# Patient Record
Sex: Female | Born: 1935 | Race: Black or African American | Hispanic: No | Smoking: Never smoker
Health system: Southern US, Community
[De-identification: ages and names within clinical notes are randomized; demographics above are authoritative.]

## PROBLEM LIST (undated history)

## (undated) DIAGNOSIS — I10 Essential (primary) hypertension: Secondary | ICD-10-CM

## (undated) DIAGNOSIS — J45909 Unspecified asthma, uncomplicated: Secondary | ICD-10-CM

## (undated) DIAGNOSIS — H409 Unspecified glaucoma: Secondary | ICD-10-CM

## (undated) DIAGNOSIS — D649 Anemia, unspecified: Secondary | ICD-10-CM

## (undated) DIAGNOSIS — I2699 Other pulmonary embolism without acute cor pulmonale: Secondary | ICD-10-CM

## (undated) DIAGNOSIS — E039 Hypothyroidism, unspecified: Secondary | ICD-10-CM

## (undated) DIAGNOSIS — J189 Pneumonia, unspecified organism: Secondary | ICD-10-CM

## (undated) DIAGNOSIS — IMO0002 Reserved for concepts with insufficient information to code with codable children: Secondary | ICD-10-CM

## (undated) DIAGNOSIS — M329 Systemic lupus erythematosus, unspecified: Secondary | ICD-10-CM

## (undated) DIAGNOSIS — I499 Cardiac arrhythmia, unspecified: Secondary | ICD-10-CM

## (undated) DIAGNOSIS — R42 Dizziness and giddiness: Secondary | ICD-10-CM

## (undated) DIAGNOSIS — I341 Nonrheumatic mitral (valve) prolapse: Secondary | ICD-10-CM

## (undated) DIAGNOSIS — Z87442 Personal history of urinary calculi: Secondary | ICD-10-CM

## (undated) DIAGNOSIS — M797 Fibromyalgia: Secondary | ICD-10-CM

## (undated) HISTORY — PX: BUNIONECTOMY: SHX129

## (undated) HISTORY — DX: Fibromyalgia: M79.7

## (undated) HISTORY — PX: ABDOMINAL HYSTERECTOMY: SHX81

## (undated) HISTORY — DX: Reserved for concepts with insufficient information to code with codable children: IMO0002

## (undated) HISTORY — DX: Systemic lupus erythematosus, unspecified: M32.9

## (undated) HISTORY — PX: ROTATOR CUFF REPAIR: SHX139

## (undated) HISTORY — DX: Nonrheumatic mitral (valve) prolapse: I34.1

## (undated) HISTORY — PX: APPENDECTOMY: SHX54

## (undated) HISTORY — PX: GLAUCOMA SURGERY: SHX656

## (undated) HISTORY — DX: Essential (primary) hypertension: I10

## (undated) HISTORY — PX: CYSTOSCOPY/RETROGRADE/URETEROSCOPY: SHX5316

## (undated) HISTORY — PX: EYE SURGERY: SHX253

## (undated) HISTORY — PX: CATARACT EXTRACTION, BILATERAL: SHX1313

---

## 1998-03-24 ENCOUNTER — Emergency Department (HOSPITAL_COMMUNITY): Admission: EM | Admit: 1998-03-24 | Discharge: 1998-03-24 | Payer: Self-pay | Admitting: Internal Medicine

## 1998-03-27 ENCOUNTER — Encounter: Admission: RE | Admit: 1998-03-27 | Discharge: 1998-06-25 | Payer: Self-pay | Admitting: *Deleted

## 1998-07-09 ENCOUNTER — Encounter: Payer: Self-pay | Admitting: *Deleted

## 1998-07-09 ENCOUNTER — Ambulatory Visit (HOSPITAL_COMMUNITY): Admission: RE | Admit: 1998-07-09 | Discharge: 1998-07-09 | Payer: Self-pay | Admitting: *Deleted

## 1999-04-08 ENCOUNTER — Emergency Department (HOSPITAL_COMMUNITY): Admission: EM | Admit: 1999-04-08 | Discharge: 1999-04-08 | Payer: Self-pay | Admitting: Emergency Medicine

## 2000-06-16 ENCOUNTER — Encounter: Payer: Self-pay | Admitting: Neurosurgery

## 2000-06-16 ENCOUNTER — Ambulatory Visit (HOSPITAL_COMMUNITY): Admission: RE | Admit: 2000-06-16 | Discharge: 2000-06-16 | Payer: Self-pay | Admitting: Neurosurgery

## 2000-11-02 ENCOUNTER — Encounter: Payer: Self-pay | Admitting: Cardiovascular Disease

## 2000-11-02 ENCOUNTER — Ambulatory Visit (HOSPITAL_COMMUNITY): Admission: RE | Admit: 2000-11-02 | Discharge: 2000-11-02 | Payer: Self-pay | Admitting: Cardiovascular Disease

## 2000-11-30 ENCOUNTER — Ambulatory Visit (HOSPITAL_COMMUNITY): Admission: RE | Admit: 2000-11-30 | Discharge: 2000-11-30 | Payer: Self-pay | Admitting: *Deleted

## 2001-05-03 ENCOUNTER — Encounter: Payer: Self-pay | Admitting: Physical Medicine and Rehabilitation

## 2001-05-03 ENCOUNTER — Encounter
Admission: RE | Admit: 2001-05-03 | Discharge: 2001-05-03 | Payer: Self-pay | Admitting: Physical Medicine and Rehabilitation

## 2006-04-18 ENCOUNTER — Ambulatory Visit (HOSPITAL_COMMUNITY): Admission: RE | Admit: 2006-04-18 | Discharge: 2006-04-18 | Payer: Self-pay | Admitting: Cardiology

## 2007-03-05 ENCOUNTER — Ambulatory Visit (HOSPITAL_COMMUNITY): Admission: RE | Admit: 2007-03-05 | Discharge: 2007-03-05 | Payer: Self-pay | Admitting: Ophthalmology

## 2007-05-28 ENCOUNTER — Ambulatory Visit (HOSPITAL_COMMUNITY): Admission: RE | Admit: 2007-05-28 | Discharge: 2007-05-28 | Payer: Self-pay | Admitting: Chiropractic Medicine

## 2007-08-30 ENCOUNTER — Ambulatory Visit (HOSPITAL_COMMUNITY): Admission: RE | Admit: 2007-08-30 | Discharge: 2007-08-30 | Payer: Self-pay | Admitting: Ophthalmology

## 2011-01-04 NOTE — Op Note (Signed)
NAMEGARLAND, Traci Wilson               ACCOUNT NO.:  [id number]   MEDICAL RECORD NO.:  [id number]          PATIENT TYPE:  AMB   LOCATION:  SDS                          FACILITY:  [facility]   PHYSICIAN:  Salley Scarlet., M.D.DATE OF BIRTH:  06-28-1936   DATE OF PROCEDURE:  DATE OF DISCHARGE:  08/30/2007                               OPERATIVE REPORT   PREOPERATIVE DIAGNOSIS:  Immature cataract, left eye.   POSTOPERATIVE DIAGNOSIS:  Immature cataract, left eye.   OPERATION:  Kelman phacoemulsification cataract, left eye, with  intraocular lens implantation.   ANESTHESIA:  Local using Xylocaine 2% with Marcaine 0.75% and Wydase.   JUSTIFICATION FOR PROCEDURE:  This is a 75 year old lady who has a  history of glaucoma who underwent cataract extraction of the right eye  several months ago with good visual result.  She still complains of  blurring of vision, particularly in her left eye.  She was evaluated and  found to have a visual acuity at best corrected 20/25 on the right and  20/70 on the left.  There was an intraocular lens present on the right  with a 1+ nuclear sclerotic cataract with posterior subcapsular  opacities of the left eye.  Cataract extraction of the left eye was  recommended.  She is admitted at this time that purpose.   PROCEDURE:  Under the influence of IV sedation Van Lint akinesia and  retrobulbar anesthesia was given.  The patient was prepped and draped in  the usual manner.  The lid speculum was inserted under the upper and  lower lids of the left eye and a 4-0 silk traction suture was passed  through the belly of the superior rectus muscle for traction.  A fornix-  based conjunctival flap was turned and hemostasis achieved by using  cautery.  An incision was made in the sclera at the limbus.  This  incision was dissected down into clear cornea using a crescent blade.  A  side port incision was made at the 1:30 o'clock position.  OcuCoat was  injected into the  eye through the side port incision.  The anterior  chamber was then entered through the corneoscleral tunnel incision at  the 11:30 o'clock position.  An anterior capsulotomy was done using a  bent 25-gauge needle.  The nucleus was hydrodissected using Xylocaine.  The KEP handpiece was passed into the eye and the nucleus was emulsified  without difficulty.  The residual cortical material was aspirated.  The  posterior capsule was polished using olive-tip polisher.  The wound was  widened slightly to accommodate a foldable silicone lens.  The lens was  seated into the eye behind the iris without difficulty.  The anterior  chamber was reformed and the pupil constricted using Miochol.  The lips  of the wound were hydrated and tested to make sure there was no leak.  After ascertaining there was no leak, the conjunctiva was closed over  the wound using thermal cautery.  One mL of Celestone and 0.5 cc of  gentamicin were injected subconjunctivally.  Maxitrol ophthalmic  ointment and pilocaine ointment were applied  along with a patch and Fox  shield.  The patient tolerated the procedure well and was discharged to  the post anesthesia recovery room in satisfactory condition.   She was instructed to rest today, to take Darvocet-N 100 every 4 hours  as needed for pain and to see me in the office tomorrow for further  evaluation.   DISCHARGED DIAGNOSIS:  Immature cataract, left eye.      Salley Scarlet., M.D.  Electronically Signed     TB/MEDQ  D:  08/30/2007  T:  08/31/2007  Job:  [id number]

## 2011-01-07 NOTE — Cardiovascular Report (Signed)
NAMEJAHZARIA, Traci Wilson               ACCOUNT NO.:  [id number]   MEDICAL RECORD NO.:  [id number]          PATIENT TYPE:  OIB   LOCATION:  2899                         FACILITY:  [facility]   PHYSICIAN:  Ricki Rodriguez, M.D.  DATE OF BIRTH:  [birth year]   DATE OF PROCEDURE:  04/18/2006  DATE OF DISCHARGE:                              CARDIAC CATHETERIZATION   PROCEDURES:  1. Left heart catheterization.  2. Selective coronary angiography.  3. Left ventricular function study.   INDICATION:  This 75 year old black female had atypical chest pain as  exertional dyspnea with abnormal stress test, showing frequent PVCs and SVT  changes.   APPROACH:  Right femoral artery using 4 French sheath and catheter.   COMPLICATIONS:  None.   HEMODYNAMIC DATA:  1. The left ventricular pressure was 137/6.  2. Aortic pressure was 143/76.  Dye, 50 cc, was used and 800 mcg of sublingual nitroglycerin was used to  control the blood pressure.   LEFT VENTRICULOGRAM:  The left ventriculogram showed normal left ventricular  systolic function with ejection fraction of 60%.   CORONARY ANATOMY:  1. The left main coronary artery was unremarkable.  2. The left anterior descending coronary artery, a diagonal-1, and      diagonal-2 vessels were unremarkable.  3. The left circumflex coronary artery with obtuse marginal branch 1 and 2      were also unremarkable.  4. The right coronary artery was dominant and its posterolateral branch      and posterior descending coronary artery all were unremarkable.   IMPRESSION:  1. Near normal coronary arteries.  2. Hypertensive heart disease.   RECOMMENDATIONS:  This patient will continue her medical therapy and follow  therapeutic lifestyle modification.      Ricki Rodriguez, M.D.  Electronically Signed     ASK/MEDQ  D:  04/18/2006  T:  04/18/2006  Job:  [id number]

## 2011-01-07 NOTE — Op Note (Signed)
NAMECORTNEY, BEISSEL               ACCOUNT NO.:  [id number]   MEDICAL RECORD NO.:  [id number]          PATIENT TYPE:  AMB   LOCATION:  SDS                          FACILITY:  [facility]   PHYSICIAN:  Salley Scarlet., M.D.DATE OF BIRTH:  February 18, 1936   DATE OF PROCEDURE:  DATE OF DISCHARGE:  03/05/2007                               OPERATIVE REPORT   PREOPERATIVE DIAGNOSIS:  Immature cataract, right eye.   POSTOPERATIVE DIAGNOSIS:  Immature cataract, right eye.   OPERATION:  Extracapsular cataract extraction with intraocular lens  implantation.   ANESTHESIA:  Local using Xylocaine 2% with Marcaine and 0.75% Wydase.   JUSTIFICATION FOR PROCEDURE:  This is a 75 year old lady who complains  of blurring of vision with difficulty seeing while driving.  She was  evaluated and found to have bilateral posterior subcapsular cataracts  with the visual acuity best corrected at 20/60 on the right, 20/50 on  the left.  Cataract extraction with intraocular lens implantation was  recommended.  She was admitted at this time for the purpose of this  procedure.   Under the influence of IV sedation, the bayonet akinesia and retrobulbar  anesthesia was given.  The patient was prepped and draped in the usual  manner.  The lid speculum was inserted under the upper and lower lid of  the right eye and a 4-0 silk traction suture was passed through the  belly of the superior rectus muscle retraction.  A fornix based  conjunctival flap was turned and hemostasis achieved using cautery.  An  incision made in the sclera at the limbus.  This incision was dissected  down to clear cornea using the crescent blade.  A side port incision  made at the 1:30 o'clock position and Ocucoat was injected into the eye  through side port incision.  The anterior chamber was then entered  through the corneal scleral tunnel incision at the 11:30 o'clock  position using a 2.75 mm keratome.  An anterior capsulotomy was done  using a bent 25 gauge needle.  The nucleus was hydrodissected.  The KPE  handpiece was passed into the eye and the nucleus was emulsified without  difficulty.  The residual cortical material was aspirated.  The  posterior capsule was polished using an olivetip polisher.  The wound  was rounded slightly to accommodate a foldable silicon lens.  The lens  was entered into the eye behind the iris without difficulty.  The  anterior chamber was reformed and the pupil was constricted using  Miochol.  The lips of the wound were hydrated and tested to make sure  that there was no leak.  After ascertaining that there was no leak, the  conjunctiva was closed over the wound using thermocautery.  Celestone 1  mL and gentamicin 0.5 mL were injected subconjunctivally.  Maxitrol  ophthalmic ointment and Polocaine ointment were applied along with a  patch and Fox shield.  The patient tolerated the procedure well and was  discharged to the post-anesthesia recovery room in satisfactory  condition.   She was instructed to rest today.  Today Vicodin every 4 hours as  needed  for pain and to see me in the office tomorrow for further evaluation.   DISCHARGE DIAGNOSIS:  Immature cataract, right eye.      Salley Scarlet., M.D.  Electronically Signed     TB/MEDQ  D:  03/07/2007  T:  03/07/2007  Job:  [id number]

## 2011-05-12 LAB — URINALYSIS, ROUTINE W REFLEX MICROSCOPIC
Glucose, UA: NEGATIVE
Hgb urine dipstick: NEGATIVE
Ketones, ur: NEGATIVE
Specific Gravity, Urine: 1.021
Urobilinogen, UA: 1
pH: 7

## 2011-05-12 LAB — BASIC METABOLIC PANEL
Calcium: 9.3
Creatinine, Ser: 0.91
GFR calc Af Amer: >60
GFR calc non Af Amer: >60
Glucose, Bld: 100 — ABNORMAL HIGH

## 2011-05-12 LAB — CBC
MCV: 83.7
Platelets: 335

## 2011-05-12 LAB — URINE MICROSCOPIC-ADD ON

## 2011-06-07 LAB — URINALYSIS, ROUTINE W REFLEX MICROSCOPIC
Nitrite: POSITIVE — AB
Urobilinogen, UA: 0.2
pH: 6

## 2011-06-07 LAB — CBC
HCT: 39.6
MCHC: 33
MCV: 82.4
RBC: 4.81
RDW: 14.4 — ABNORMAL HIGH
WBC: 6.4

## 2011-06-07 LAB — BASIC METABOLIC PANEL
CO2: 28
Calcium: 9.3
Creatinine, Ser: 0.89
GFR calc non Af Amer: >60
Glucose, Bld: 94

## 2011-06-07 LAB — URINE MICROSCOPIC-ADD ON

## 2012-03-06 ENCOUNTER — Ambulatory Visit (INDEPENDENT_AMBULATORY_CARE_PROVIDER_SITE_OTHER): Payer: Medicare Other | Admitting: Internal Medicine

## 2012-03-06 ENCOUNTER — Encounter: Payer: Self-pay | Admitting: Internal Medicine

## 2012-03-06 VITALS — BP 142/58 | HR 97 | Temp 98.4°F | Ht 66.0 in | Wt 259.8 lb

## 2012-03-06 DIAGNOSIS — R05 Cough: Secondary | ICD-10-CM | POA: Insufficient documentation

## 2012-03-06 MED ORDER — OMEPRAZOLE 20 MG PO CPDR: 20.0000 mg | DELAYED_RELEASE_CAPSULE | Freq: Every day | ORAL | Status: DC

## 2012-03-06 MED ORDER — PREDNISONE (PAK) 10 MG PO TABS: ORAL_TABLET | ORAL | Status: DC

## 2012-03-06 MED ORDER — AMOXICILLIN-POT CLAVULANATE 875-125 MG PO TABS: 1.0000 | ORAL_TABLET | Freq: Two times a day (BID) | ORAL | Status: DC

## 2012-03-06 MED ORDER — FAMOTIDINE 20 MG PO TABS: ORAL_TABLET | ORAL | Status: DC

## 2012-03-06 NOTE — Patient Instructions (Addendum)
Augmentin 875 one twice daily x 10 days with bfast and supper, yogurt for lunch  Prednisone 10 mg take  4 each am x 2 days,   2 each am x 2 days,  1 each am x2days and stop   Try prilosec 20mg   Take 30-60 min before first meal of the day and Pepcid 20 mg one bedtime until return  GERD (REFLUX)  is an extremely common cause of respiratory symptoms, many times with no significant heartburn at all.    It can be treated with medication, but also with lifestyle changes including avoidance of late meals, excessive alcohol, smoking cessation, and avoid fatty foods, chocolate, peppermint, colas, red wine, and acidic juices such as orange juice.  NO MINT OR MENTHOL PRODUCTS SO NO COUGH DROPS  USE SUGARLESS CANDY INSTEAD (jolley ranchers or Stover's)  NO OIL BASED VITAMINS - use powdered substitutes.    Please schedule a follow up office visit in 2 weeks, sooner if needed with cxr bring with you all medication and inhalers

## 2012-03-06 NOTE — Progress Notes (Signed)
  Subjective:    Patient ID: Traci Wilson, female    DOB: [birth year] MRN: [id number]  HPI  82 yobf never smoker with dx of lupus by Phylliss Bob flares with sun exposure treats herself with asa prn no allergies or asthma but always coughs around cigarette smoke all her life then refractory cough since end of June 2013 therefore referred to pulmonary clinic by Dr Algie Coffer.   03/06/2012 1st pulmonary ov cc abupt onset of cold like symptoms end of June 2013 > went into chest from throat and nose with dry day > night cough initially  rx July 11 cipro then green mucus onset 03/06/2012 (the day of the ov)  For years, uses albuterol sev times a month "when overdoes it" in terms of activity, no seasonal pattern or assoc sinus or hb symptoms.  Last flare of lupus requiring prednisone was 3 years prior to OV   - denies flare of arthritis or any pleuritic cp with this episode of coughing.  Sleeping ok without nocturnal  or early am exacerbation  of respiratory  c/o's or need for noct saba. Also denies any obvious fluctuation of symptoms with weather or environmental changes or other aggravating or alleviating factors except as outlined above     Review of Systems  Constitutional: Positive for appetite change. Negative for fever, chills and unexpected weight change.  HENT: Negative for ear pain, nosebleeds, congestion, sore throat, rhinorrhea, sneezing, trouble swallowing, dental problem, voice change, postnasal drip and sinus pressure.   Eyes: Negative for visual disturbance.  Respiratory: Positive for cough and shortness of breath. Negative for choking.   Cardiovascular: Negative for chest pain and leg swelling.  Gastrointestinal: Negative for vomiting, abdominal pain and diarrhea.  Genitourinary: Negative for difficulty urinating.  Musculoskeletal: Negative for arthralgias.  Skin: Negative for rash.  Neurological: Negative for tremors, syncope and headaches.  Hematological: Does not bruise/bleed easily.        Objective:   Physical Exam  amb wf nad but very junky cough  03/06/2012 wt 259  HEENT: nl dentition, turbinates, and orophanx. Nl external ear canals without cough reflex   NECK :  without JVD/Nodes/TM/ nl carotid upstrokes bilaterally   LUNGS: no acc muscle use, clear to A and P bilaterally without cough on insp or exp maneuvers   CV:  RRR  no s3 or murmur or increase in P2, no edema   ABD:  soft and nontender with nl excursion in the supine position. No bruits or organomegaly, bowel sounds nl  MS:  warm without deformities, calf tenderness, cyanosis or clubbing  SKIN: warm and dry without lesions    NEURO:  alert, approp, no deficits    Ct 02/27/12 ? asd L base vs atx       Assessment & Plan:

## 2012-03-07 ENCOUNTER — Telehealth: Payer: Self-pay | Admitting: Internal Medicine

## 2012-03-07 DIAGNOSIS — R05 Cough: Secondary | ICD-10-CM

## 2012-03-07 MED ORDER — FAMOTIDINE 20 MG PO TABS: ORAL_TABLET | ORAL | Status: DC

## 2012-03-07 MED ORDER — PREDNISONE (PAK) 10 MG PO TABS: ORAL_TABLET | ORAL | Status: AC

## 2012-03-07 MED ORDER — AMOXICILLIN-POT CLAVULANATE 875-125 MG PO TABS: 1.0000 | ORAL_TABLET | Freq: Two times a day (BID) | ORAL | Status: AC

## 2012-03-07 MED ORDER — OMEPRAZOLE 20 MG PO CPDR: 20.0000 mg | DELAYED_RELEASE_CAPSULE | Freq: Every day | ORAL | Status: DC

## 2012-03-07 NOTE — Telephone Encounter (Signed)
Called spoke with patient who stated she did not receive her prescriptions from yesterday's visit.  Per pt's chart, rx's were sent to the [facility] on [address] > pt stated she uses [facility] on [address].  [facility], cancelled rx's and sent everything to [facility].  Nothing further needed per pt.

## 2012-03-11 NOTE — Assessment & Plan Note (Signed)
The most common causes of chronic cough in immunocompetent adults include the following: upper airway cough syndrome (UACS), previously referred to as postnasal drip syndrome (PNDS), which is caused by variety of rhinosinus conditions; (2) asthma; (3) GERD; (4) chronic bronchitis from cigarette smoking or other inhaled environmental irritants; (5) nonasthmatic eosinophilic bronchitis; and (6) bronchiectasis.   These conditions, singly or in combination, have accounted for up to 94% of the causes of chronic cough in prospective studies.   Other conditions have constituted no >6% of the causes in prospective studies These have included bronchogenic carcinoma, chronic interstitial pneumonia, sarcoidosis, left ventricular failure, ACEI-induced cough, and aspiration from a condition associated with pharyngeal dysfunction.   .Chronic cough is often simultaneously caused by more than one condition. A single cause has been found from 38 to 82% of the time, multiple causes from 18 to 62%. Multiply caused cough has been the result of three diseases up to 42% of the time.    This is most likely a form of  Classic Upper airway cough syndrome, so named because it's frequently impossible to sort out how much is  CR/sinusitis with freq throat clearing (which can be related to primary GERD)   vs  causing  secondary (" extra esophageal")  GERD from wide swings in gastric pressure that occur with throat clearing, often  promoting self use of mint and menthol lozenges that reduce the lower esophageal sphincter tone and exacerbate the problem further in a cyclical fashion.   These are the same pts (now being labeled as having "irritable larynx syndrome" by some cough centers) who not infrequently have a history of having failed to tolerate ace inhibitors,  dry powder inhalers or biphosphonates or report having atypical reflux symptoms that don't respond to standard doses of PPI , and are easily confused as having aecopd or  asthma flares by even experienced allergists/ pulmonologists.  However, this pt also has lupus hx so needs close f/u to be sure all her symptoms are adequately addressed  See instructions for specific recommendations which were reviewed directly with the patient who was given a copy with highlighter outlining the key components.

## 2012-03-23 ENCOUNTER — Encounter: Payer: Self-pay | Admitting: Internal Medicine

## 2012-03-23 ENCOUNTER — Ambulatory Visit (INDEPENDENT_AMBULATORY_CARE_PROVIDER_SITE_OTHER): Payer: Medicare Other | Admitting: Internal Medicine

## 2012-03-23 ENCOUNTER — Ambulatory Visit (INDEPENDENT_AMBULATORY_CARE_PROVIDER_SITE_OTHER)
Admission: RE | Admit: 2012-03-23 | Discharge: 2012-03-23 | Disposition: A | Discharge status: Home / Self Care | Payer: Medicare Other | Source: Ambulatory Visit | Attending: Internal Medicine | Admitting: Internal Medicine

## 2012-03-23 VITALS — BP 112/68 | HR 80 | Temp 98.2°F | Ht 66.0 in | Wt 255.0 lb

## 2012-03-23 DIAGNOSIS — R05 Cough: Secondary | ICD-10-CM

## 2012-03-23 DIAGNOSIS — R0989 Other specified symptoms and signs involving the circulatory and respiratory systems: Secondary | ICD-10-CM

## 2012-03-23 MED ORDER — MOMETASONE FURO-FORMOTEROL FUM 100-5 MCG/ACT IN AERO: 2.0000 | INHALATION_SPRAY | Freq: Two times a day (BID) | RESPIRATORY_TRACT | Status: DC

## 2012-03-23 NOTE — Progress Notes (Signed)
  Subjective:    Patient ID: Traci Wilson, female    DOB: February 04, 1936 MRN: [id number]  HPI  82 yobf never smoker with dx of lupus by Traci Wilson flares with sun exposure treats herself with asa prn no allergies or asthma but always coughs around cigarette smoke all her life then refractory cough since end of June 2013 therefore referred to pulmonary clinic by Dr Algie Coffer.   03/06/2012 1st pulmonary ov cc abupt onset of cold like symptoms end of June 2013 > went into chest from throat and nose with dry day > night cough initially  rx July 11 cipro then green mucus onset 03/06/2012 (the day of the ov)  For years, uses albuterol sev times a month "when overdoes it" in terms of activity, no seasonal pattern or assoc sinus or hb symptoms.  Last flare of lupus requiring prednisone was 3 years prior to OV   - denies flare of arthritis or any pleuritic cp with this episode of coughing. rec Augmentin 875 one twice daily x 10 days with bfast and supper, yogurt for lunch Prednisone 10 mg take  4 each am x 2 days,   2 each am x 2 days,  1 each am x2days and stop  Try prilosec 20mg   Take 30-60 min before first meal of the day and Pepcid 20 mg one bedtime until return GERD diet   03/23/2012 f/u ov/[name] cc much better, still coughing some but no purulent sputum, no active sinus symptoms or hb. Not limited by breathing. Still using tussionex at hs and sev times per day albuterol for cough control despite compliance with ppi and h2hs as rec  Sleeping ok without nocturnal  or early am exacerbation  of respiratory  c/o's or need for noct saba. Also denies any obvious fluctuation of symptoms with weather or environmental changes or other aggravating or alleviating factors except as outlined above   ROS  The following are not active complaints unless bolded sore throat, dysphagia, dental problems, itching, sneezing,  nasal congestion or excess/ purulent secretions, ear ache,   fever, chills, sweats, unintended wt loss,  pleuritic or exertional cp, hemoptysis,  orthopnea pnd or leg swelling, presyncope, palpitations, heartburn, abdominal pain, anorexia, nausea, vomiting, diarrhea  or change in bowel or urinary habits, change in stools or urine, dysuria,hematuria,  rash, arthralgias, visual complaints, headache, numbness weakness or ataxia or problems with walking or coordination,  change in mood/affect or memory.              Objective:   Physical Exam  amb wf nad no longer coughing  03/06/2012 wt 259 > 03/23/2012 255  HEENT: nl dentition, turbinates, and orophanx. Nl external ear canals without cough reflex   NECK :  without JVD/Nodes/TM/ nl carotid upstrokes bilaterally   LUNGS: no acc muscle use, exp rhonchi bilaterally   CV:  RRR  no s3 or murmur or increase in P2, no edema   ABD:  soft and nontender with nl excursion in the supine position. No bruits or organomegaly, bowel sounds nl  MS:  warm without deformities, calf tenderness, cyanosis or clubbing  SKIN: warm and dry without lesions    NEURO:  alert, approp, no deficits    CXR  03/23/2012 :  Hyperexpanded lungs without acute cardiopulmonary disease.         Assessment & Plan:

## 2012-03-23 NOTE — Assessment & Plan Note (Signed)
She has persistent exp rhonchi p rx for acute bronchitis so needs f/u pft's and in meantime will ask her to use prn dulera 100 2 bid since still has dependency to albuterol and tussionex which I advised is not a longterm answer   The proper method of use, as well as anticipated side effects, of a metered-dose inhaler are discussed and demonstrated to the patient. Improved effectiveness after extensive coaching during this visit to a level of approximately  75%

## 2012-03-23 NOTE — Patient Instructions (Addendum)
Dulera 100 2 every 12 hours if needed for cough or wheeze  Work on inhaler technique:  relax and gently blow all the way out then take a nice smooth deep breath back in, triggering the inhaler at same time you start breathing in.  Hold for up to 5 seconds if you can.  Rinse and gargle with water when done   If your mouth or throat starts to bother you,   I suggest you time the inhaler to your dental care and after using the inhaler(s) brush teeth and tongue with a baking soda containing toothpaste and when you rinse this out, gargle with it first to see if this helps your mouth and throat.     Please schedule a follow up office visit in 4 weeks, sooner if needed pft's

## 2012-03-27 NOTE — Progress Notes (Signed)
Quick Note:  Spoke with pt and notified of results per Dr. Wert. Pt verbalized understanding and denied any questions.  ______ 

## 2012-05-09 ENCOUNTER — Encounter: Payer: Medicare Other | Admitting: Internal Medicine

## 2012-05-09 NOTE — Progress Notes (Signed)
 This encounter was created in error - please disregard.

## 2012-05-29 ENCOUNTER — Other Ambulatory Visit (INDEPENDENT_AMBULATORY_CARE_PROVIDER_SITE_OTHER): Payer: Medicare Other

## 2012-05-29 ENCOUNTER — Other Ambulatory Visit: Payer: Self-pay | Admitting: Internal Medicine

## 2012-05-29 ENCOUNTER — Encounter: Payer: Self-pay | Admitting: Internal Medicine

## 2012-05-29 ENCOUNTER — Ambulatory Visit (INDEPENDENT_AMBULATORY_CARE_PROVIDER_SITE_OTHER): Payer: Medicare Other | Admitting: Internal Medicine

## 2012-05-29 VITALS — BP 138/72 | HR 92 | Temp 98.2°F | Ht 66.0 in | Wt 250.0 lb

## 2012-05-29 DIAGNOSIS — R0600 Dyspnea, unspecified: Secondary | ICD-10-CM

## 2012-05-29 DIAGNOSIS — R0989 Other specified symptoms and signs involving the circulatory and respiratory systems: Secondary | ICD-10-CM

## 2012-05-29 DIAGNOSIS — E039 Hypothyroidism, unspecified: Secondary | ICD-10-CM

## 2012-05-29 DIAGNOSIS — R05 Cough: Secondary | ICD-10-CM

## 2012-05-29 LAB — CBC WITH DIFFERENTIAL/PLATELET
Basophils Absolute: 0.1 10*3/uL (ref 0.0–0.1)
Basophils Relative: 0.6 % (ref 0.0–3.0)
Eosinophils Relative: 1.7 % (ref 0.0–5.0)
HCT: 42.5 % (ref 36.0–46.0)
Lymphocytes Relative: 52.5 % — ABNORMAL HIGH (ref 12.0–46.0)
Lymphs Abs: 4.9 10*3/uL — ABNORMAL HIGH (ref 0.7–4.0)
Monocytes Absolute: 0.8 10*3/uL (ref 0.1–1.0)
Neutro Abs: 3.4 10*3/uL (ref 1.4–7.7)
Platelets: 285 10*3/uL (ref 150.0–400.0)
RDW: 16.2 % — ABNORMAL HIGH (ref 11.5–14.6)

## 2012-05-29 LAB — BASIC METABOLIC PANEL
BUN: 10 mg/dL (ref 6–23)
CO2: 26 mEq/L (ref 19–32)
Chloride: 104 mEq/L (ref 96–112)
Creatinine, Ser: 1 mg/dL (ref 0.4–1.2)

## 2012-05-29 LAB — PULMONARY FUNCTION TEST

## 2012-05-29 MED ORDER — LEVOTHYROXINE SODIUM 50 MCG PO TABS: 50.0000 ug | ORAL_TABLET | Freq: Every day | ORAL | Status: DC

## 2012-05-29 NOTE — Progress Notes (Signed)
PFT done today. 

## 2012-05-29 NOTE — Progress Notes (Signed)
Subjective:    Patient ID: Traci Wilson, female    DOB: [birth year]  MRN: [id number]    Brief patient profile:  60 yobf never smoker with dx of lupus by Phylliss Bob flares with sun exposure treats herself with asa prn no allergies or asthma but always coughs around cigarette smoke all her life then refractory cough since end of June 2013 therefore referred to pulmonary clinic by Dr Algie Coffer.   03/06/2012 1st pulmonary ov cc abupt onset of cold like symptoms end of June 2013 > went into chest from throat and nose with dry day > night cough initially  rx July 11 cipro then green mucus onset 03/06/2012 (the day of the ov)  For years, uses albuterol sev times a month "when overdoes it" in terms of activity, no seasonal pattern or assoc sinus or hb symptoms.  Last flare of lupus requiring prednisone was 3 years prior to OV   - denies flare of arthritis or any pleuritic cp with this episode of coughing. rec Augmentin 875 one twice daily x 10 days with bfast and supper, yogurt for lunch Prednisone 10 mg take  4 each am x 2 days,   2 each am x 2 days,  1 each am x2days and stop  Try prilosec 20mg   Take 30-60 min before first meal of the day and Pepcid 20 mg one bedtime until return GERD diet   03/23/2012 f/u ov/Wert cc much better, still coughing some but no purulent sputum, no active sinus symptoms or hb. Not limited by breathing. Still using tussionex at hs and sev times per day albuterol for cough control despite compliance with ppi and h2hs as rec rec Dulera 100 2 every 12 hours if needed for cough or wheeze Work on inhaler technique Return p pft's  05/09/2012 f/u ov/Wert cc missed appt  05/29/2012 f/u ov/Wert cc much better cc  some doe just walking more than room to room so that's when she uses dulera but not more than once or twice daily - No obvious daytime variabilty or assoc chronic cough or cp or chest tightness, subjective wheeze overt sinus or hb symptoms. No unusual exp hx    Sleeping ok  without nocturnal  or early am exacerbation  of respiratory  c/o's or need for noct saba. Also denies any obvious fluctuation of symptoms with weather or environmental changes or other aggravating or alleviating factors except as outlined above   ROS  The following are not active complaints unless bolded sore throat, dysphagia, dental problems, itching, sneezing,  nasal congestion or excess/ purulent secretions, ear ache,   fever, chills, sweats, unintended wt loss, pleuritic or exertional cp, hemoptysis,  orthopnea pnd or leg swelling, presyncope, palpitations, heartburn, abdominal pain, anorexia, nausea, vomiting, diarrhea  or change in bowel or urinary habits, change in stools or urine, dysuria,hematuria,  rash, arthralgias, visual complaints, headache, numbness weakness or ataxia or problems with walking or coordination,  change in mood/affect or memory.              Objective:   Physical Exam  amb wf nad no longer coughing  03/06/2012 wt 259 > 03/23/2012 255 > 05/09/2012  250   HEENT: nl dentition, turbinates, and orophanx. Nl external ear canals without cough reflex   NECK :  without JVD/Nodes/TM/ nl carotid upstrokes bilaterally   LUNGS: no acc muscle use, exp rhonchi bilaterally   CV:  RRR  no s3 or murmur or increase in P2, no edema   ABD:  soft and nontender with nl excursion in the supine position. No bruits or organomegaly, bowel sounds nl  MS:  warm without deformities, calf tenderness, cyanosis or clubbing  SKIN: warm and dry without lesions         CXR  03/23/2012 :  Hyperexpanded lungs without acute cardiopulmonary disease.   05/29/2012 TSH 12         Assessment & Plan:

## 2012-05-29 NOTE — Progress Notes (Signed)
Quick Note:  Spoke with pt and notified of results per Dr. Wert. Pt verbalized understanding and denied any questions.  ______ 

## 2012-05-29 NOTE — Assessment & Plan Note (Signed)
-   PFT's 05/29/2012 FEV1 1.53 (73%) ratio 75 and no better p B2, DLCO 51 but corrects to 131 and ERV 11%   - 05/29/2012  Walked RA x 3 laps @ 185 ft each stopped due to end of study no desat    Clearly her problem is obesity / hypothyroid (discussed separately) Very little to suggest asthma, so ok to use dulera if she really feels it helps but better to just pace herself appropriately and work on wt loss

## 2012-05-29 NOTE — Assessment & Plan Note (Signed)
TSH 12 05/29/2012 > rx synthroid 50 mcg/day  I had an extended discussion with the patient today lasting 15 to 20 minutes of a 25 minute visit on the following issues:  Her problem is her wt and she needs to put herself into neg cal balance

## 2012-05-29 NOTE — Assessment & Plan Note (Signed)
-   Resolved 05/29/2012 with rx of GERD

## 2012-05-29 NOTE — Patient Instructions (Signed)
Weight control is simply a matter of calorie balance which needs to be tilted in your favor by eating less and exercising more.  To get the most out of exercise, you need to be continuously aware that you are short of breath, but never out of breath, for 30 minutes daily. As you improve, it will actually be easier for you to do the same amount of exercise  in  30 minutes so always push to the level where you are short of breath.  If this does not result in gradual weight reduction then I strongly recommend you see a nutritionist with a food diary x 2 weeks so that we can work out a negative calorie balance which is universally effective in steady weight loss programs.  Think of your calorie balance like you do your bank account where in this case you want the balance to go down so you must take in less calories than you burn up.  It's just that simple:  Hard to do, but easy to understand.  Good luck!    

## 2012-06-07 ENCOUNTER — Encounter: Payer: Self-pay | Admitting: Internal Medicine

## 2012-10-19 ENCOUNTER — Encounter: Payer: Medicare Other | Attending: Emergency Medicine | Admitting: Dietician

## 2013-04-30 ENCOUNTER — Ambulatory Visit
Admission: RE | Admit: 2013-04-30 | Discharge: 2013-04-30 | Disposition: A | Discharge status: Home / Self Care | Payer: Medicare Other | Source: Ambulatory Visit | Attending: Cardiovascular Disease | Admitting: Cardiovascular Disease

## 2013-04-30 ENCOUNTER — Other Ambulatory Visit: Payer: Self-pay | Admitting: Cardiovascular Disease

## 2013-04-30 DIAGNOSIS — R140 Abdominal distension (gaseous): Secondary | ICD-10-CM

## 2013-10-18 ENCOUNTER — Other Ambulatory Visit: Payer: Self-pay | Admitting: Cardiovascular Disease

## 2013-10-18 ENCOUNTER — Ambulatory Visit
Admission: RE | Admit: 2013-10-18 | Discharge: 2013-10-18 | Disposition: A | Discharge status: Home / Self Care | Payer: Commercial Managed Care - HMO | Source: Ambulatory Visit | Attending: Cardiovascular Disease | Admitting: Cardiovascular Disease

## 2013-10-18 DIAGNOSIS — J40 Bronchitis, not specified as acute or chronic: Secondary | ICD-10-CM

## 2014-12-03 DIAGNOSIS — R350 Frequency of micturition: Secondary | ICD-10-CM | POA: Insufficient documentation

## 2014-12-03 DIAGNOSIS — R351 Nocturia: Secondary | ICD-10-CM | POA: Insufficient documentation

## 2014-12-03 DIAGNOSIS — N816 Rectocele: Secondary | ICD-10-CM | POA: Insufficient documentation

## 2014-12-03 DIAGNOSIS — K5901 Slow transit constipation: Secondary | ICD-10-CM | POA: Insufficient documentation

## 2015-06-01 ENCOUNTER — Other Ambulatory Visit: Payer: Self-pay | Admitting: Cardiovascular Disease

## 2015-06-01 ENCOUNTER — Ambulatory Visit
Admission: RE | Admit: 2015-06-01 | Discharge: 2015-06-01 | Disposition: A | Discharge status: Home / Self Care | Payer: Medicare Other | Source: Ambulatory Visit | Attending: Cardiovascular Disease | Admitting: Cardiovascular Disease

## 2015-06-01 DIAGNOSIS — R0781 Pleurodynia: Secondary | ICD-10-CM

## 2015-06-02 ENCOUNTER — Ambulatory Visit
Admission: RE | Admit: 2015-06-02 | Discharge: 2015-06-02 | Disposition: A | Discharge status: Home / Self Care | Payer: Medicare Other | Source: Ambulatory Visit | Attending: Cardiovascular Disease | Admitting: Cardiovascular Disease

## 2015-06-02 DIAGNOSIS — R0781 Pleurodynia: Secondary | ICD-10-CM

## 2015-08-23 DIAGNOSIS — R42 Dizziness and giddiness: Secondary | ICD-10-CM

## 2015-08-23 HISTORY — DX: Dizziness and giddiness: R42

## 2016-06-12 ENCOUNTER — Encounter (HOSPITAL_COMMUNITY): Payer: Self-pay | Admitting: Emergency Medicine

## 2016-06-12 ENCOUNTER — Emergency Department (HOSPITAL_COMMUNITY)
Admission: EM | Admit: 2016-06-12 | Discharge: 2016-06-12 | Disposition: A | Discharge status: Home / Self Care | Payer: Medicare Other | Attending: Emergency Medicine | Admitting: Emergency Medicine

## 2016-06-12 DIAGNOSIS — Y999 Unspecified external cause status: Secondary | ICD-10-CM | POA: Insufficient documentation

## 2016-06-12 DIAGNOSIS — Y92410 Unspecified street and highway as the place of occurrence of the external cause: Secondary | ICD-10-CM | POA: Diagnosis not present

## 2016-06-12 DIAGNOSIS — S199XXA Unspecified injury of neck, initial encounter: Secondary | ICD-10-CM | POA: Diagnosis present

## 2016-06-12 DIAGNOSIS — Y939 Activity, unspecified: Secondary | ICD-10-CM | POA: Diagnosis not present

## 2016-06-12 DIAGNOSIS — S161XXA Strain of muscle, fascia and tendon at neck level, initial encounter: Secondary | ICD-10-CM

## 2016-06-12 DIAGNOSIS — E039 Hypothyroidism, unspecified: Secondary | ICD-10-CM | POA: Insufficient documentation

## 2016-06-12 DIAGNOSIS — I10 Essential (primary) hypertension: Secondary | ICD-10-CM | POA: Diagnosis not present

## 2016-06-12 MED ORDER — IBUPROFEN 600 MG PO TABS: 600.0000 mg | ORAL_TABLET | Freq: Four times a day (QID) | ORAL | 0 refills | Status: DC | PRN

## 2016-06-12 MED ORDER — CYCLOBENZAPRINE HCL 10 MG PO TABS: 10.0000 mg | ORAL_TABLET | Freq: Two times a day (BID) | ORAL | 0 refills | Status: DC | PRN
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
## 2017-03-30 ENCOUNTER — Encounter (HOSPITAL_COMMUNITY): Payer: Self-pay | Admitting: Emergency Medicine

## 2017-03-30 DIAGNOSIS — M545 Low back pain: Secondary | ICD-10-CM | POA: Diagnosis present

## 2017-03-30 DIAGNOSIS — I10 Essential (primary) hypertension: Secondary | ICD-10-CM | POA: Diagnosis not present

## 2017-03-30 DIAGNOSIS — N3000 Acute cystitis without hematuria: Secondary | ICD-10-CM | POA: Insufficient documentation

## 2017-03-30 DIAGNOSIS — Z79899 Other long term (current) drug therapy: Secondary | ICD-10-CM | POA: Diagnosis not present

## 2017-03-30 NOTE — ED Triage Notes (Addendum)
Pt from home with c/o back pain, nausea that began around 1300 today. Pt reports 2 episodes of emesis  But denies diarrhea and constipation. Pt is not tender to palpation. Pt states back pain is localized around right flank area. Pt denies CP. Pt is hypertensive at time of assessment and has hx of same

## 2017-03-31 ENCOUNTER — Emergency Department (HOSPITAL_COMMUNITY)
Admission: EM | Admit: 2017-03-31 | Discharge: 2017-03-31 | Disposition: A | Discharge status: Home / Self Care | Payer: Medicare Other | Attending: Emergency Medicine | Admitting: Emergency Medicine

## 2017-03-31 DIAGNOSIS — N3000 Acute cystitis without hematuria: Secondary | ICD-10-CM

## 2017-03-31 LAB — URINALYSIS, ROUTINE W REFLEX MICROSCOPIC
Bilirubin Urine: NEGATIVE
GLUCOSE, UA: NEGATIVE mg/dL
KETONES UR: NEGATIVE mg/dL
NITRITE: POSITIVE — AB
Protein, ur: 100 mg/dL — AB
Specific Gravity, Urine: 1.011 (ref 1.005–1.030)
pH: 6 (ref 5.0–8.0)

## 2017-03-31 MED ORDER — CEPHALEXIN 500 MG PO CAPS: 500.0000 mg | ORAL_CAPSULE | Freq: Two times a day (BID) | ORAL | 0 refills | Status: DC

## 2017-03-31 MED ORDER — CEPHALEXIN 500 MG PO CAPS
500.0000 mg | ORAL_CAPSULE | Freq: Once | ORAL | Status: AC
  Administered 2017-03-31: 500 mg via ORAL
  Filled 2017-03-31: qty 1

## 2017-03-31 MED ORDER — ONDANSETRON 4 MG PO TBDP: 4.0000 mg | ORAL_TABLET | Freq: Three times a day (TID) | ORAL | 0 refills | Status: DC | PRN

## 2017-03-31 NOTE — ED Provider Notes (Signed)
TIME SEEN: 3:44 AM  CHIEF COMPLAINT: Lower back pain, nausea and vomiting  HPI: Patient is a 81 year old female with history of hypertension, lupus, fibromyalgia who presents emergency department with bilateral lower back pain and nausea and vomiting that started yesterday. She states her symptoms have now completely resolved. She states she is ready to go home. She denies any fevers, chills, abdominal pain, diarrhea, chest pain or shortness of breath. No injury to her back. No aggravating or relieving factors. No numbness, tingling or focal weakness. She always has some urge incontinence but no overflow incontinence. No incontinence of her bowel. No urinary retention. No history of kidney stones.  ROS: See HPI Constitutional: no fever  Eyes: no drainage  ENT: no runny nose   Cardiovascular:  no chest pain  Resp: no SOB  GI:  vomiting GU: no dysuria Integumentary: no rash  Allergy: no hives  Musculoskeletal: no leg swelling  Neurological: no slurred speech ROS otherwise negative  PAST MEDICAL HISTORY/PAST SURGICAL HISTORY:  Past Medical History:  Diagnosis Date  . Fibromyalgia   . Hypertension   . Lupus   . MVP (mitral valve prolapse)     MEDICATIONS:  Prior to Admission medications   Medication Sig Start Date End Date Taking? Authorizing Provider  albuterol (PROAIR HFA) 108 (90 BASE) MCG/ACT inhaler Inhale 2 puffs into the lungs every 6 (six) hours as needed.   Yes [provider]  levothyroxine (SYNTHROID, LEVOTHROID) 150 MCG tablet Take 1 tablet by mouth daily. 03/30/17  Yes [provider]  meclizine (ANTIVERT) 12.5 MG tablet Take 1 tablet by mouth 3 (three) times daily. 03/30/17  Yes [provider]  bimatoprost (LUMIGAN) 0.01 % SOLN Apply 1 drop to eye at bedtime.    [provider]  cyclobenzaprine (FLEXERIL) 10 MG tablet Take 1 tablet (10 mg total) by mouth 2 (two) times daily as needed for muscle spasms. 06/12/16   Arby Barrette, MD   diazepam (VALIUM) 5 MG tablet Take 5 mg by mouth every 6 (six) hours as needed.    [provider]  famotidine (PEPCID) 20 MG tablet One at bedtime 03/07/12 03/07/13  Nyoka Cowden, MD  ibuprofen (ADVIL,MOTRIN) 600 MG tablet Take 1 tablet (600 mg total) by mouth every 6 (six) hours as needed. 06/12/16   Arby Barrette, MD  levothyroxine (SYNTHROID) 50 MCG tablet Take 1 tablet (50 mcg total) by mouth daily. 05/29/12   Nyoka Cowden, MD  mometasone-formoterol (DULERA) 100-5 MCG/ACT AERO Inhale 2 puffs into the lungs 2 (two) times daily. 03/23/12 03/23/13  Nyoka Cowden, MD  omeprazole (PRILOSEC) 20 MG capsule Take 1 capsule (20 mg total) by mouth daily. 03/07/12 03/07/13  Nyoka Cowden, MD    ALLERGIES:  Allergies  Allergen Reactions  . Codeine     Unknown reaction    SOCIAL HISTORY:  Social History  Substance Use Topics  . Smoking status: Never Smoker  . Smokeless tobacco: Never Used  . Alcohol use No    FAMILY HISTORY: No family history on file.  EXAM: BP (!) 181/70 (BP Location: Left Arm)   Pulse (!) 104   Temp 98.7 F (37.1 C) (Oral)   Resp 18   SpO2 96%  CONSTITUTIONAL: Alert and oriented and responds appropriately to questions. Well-appearing; well-nourished, Elderly HEAD: Normocephalic EYES: Conjunctivae clear, pupils appear equal, EOMI ENT: normal nose; moist mucous membranes NECK: Supple, no meningismus, no nuchal rigidity, no LAD  CARD: RRR; S1 and S2 appreciated; no murmurs, no clicks,  no rubs, no gallops RESP: Normal chest excursion without splinting or tachypnea; breath sounds clear and equal bilaterally; no wheezes, no rhonchi, no rales, no hypoxia or respiratory distress, speaking full sentences ABD/GI: Normal bowel sounds; non-distended; soft, non-tender, no rebound, no guarding, no peritoneal signs, no hepatosplenomegaly BACK:  The back appears normal and is non-tender to palpation, there is no CVA tenderness, no midline spinal tenderness or step-off  or deformity EXT: Normal ROM in all joints; non-tender to palpation; no edema; normal capillary refill; no cyanosis, no calf tenderness or swelling    SKIN: Normal color for age and race; warm; no rash NEURO: Moves all extremities equally, strength 5/5 in all 4 extremities, 2+ DTR reflexes in bilateral upper lower extremities, normal gait, sensation to light touch intact diffusely, no saddle anesthesia PSYCH: The patient's mood and manner are appropriate. Grooming and personal hygiene are appropriate.  MEDICAL DECISION MAKING: Patient here with complaints of lower back pain and nausea and vomiting that started yesterday but has now completely resolved. Patient states she is ready to go home and states she has a follow-up appointment with her primary care physician next week. She refuses blood test but does agree to allow us to obtain urinalysis. This time she is neurologically intact. No injury to her back to suggest fracture and she has no midline tenderness. This could have been a kidney stone that has resolved. Also could've been a urinary tract infection but this time I have low suspicion for pyelonephritis. Her abdominal exam is completely benign. She is neurologically intact. I doubt that this is significant spinal stenosis, cauda equina, epidural abscess or hematoma, discitis, transverse myelitis. I do not feel she needs emergent imaging of her back. Patient declines any pain or nausea medication at this time. Her family is at bedside and they are comfortable with this plan. She was initially very hypertensive with EMS but this is improved without intervention and is now normal. Doubt dissection.  ED PROGRESS: Patient appears to have urinary tract infection. She states she is still feeling well and wants to go home. Refuses any further workup. No flank pain on exam. Doubt pyelonephritis. Will discharge on Keflex. Urine culture has been sent. Recommended Tylenol at home as needed. We'll discharge with  Zofran as well. She has a PCP follow-up scheduled next week. Discussed at length return precautions. Patient and family comfortable with this plan.   At this time, I do not feel there is any life-threatening condition present. I have reviewed and discussed all results (EKG, imaging, lab, urine as appropriate) and exam findings with patient/family. I have reviewed nursing notes and appropriate previous records.  I feel the patient is safe to be discharged home without further emergent workup and can continue workup as an outpatient as needed. Discussed usual and customary return precautions. Patient/family verbalize understanding and are comfortable with this plan.  Outpatient follow-up has been provided if needed. All questions have been answered.      Kimberlea Schlag, Layla MawKristen N, DO 03/31/17 [phone]

## 2017-03-31 NOTE — ED Notes (Signed)
PT ambulate to restroom as well as inside room PT state " I feel fine no light head"

## 2017-03-31 NOTE — Discharge Instructions (Signed)
You may take over-the-counter Tylenol 1000 mg every 6 hours as needed for pain. 

## 2017-04-01 LAB — URINE CULTURE

## 2018-01-24 ENCOUNTER — Ambulatory Visit: Payer: Medicare HMO | Admitting: Occupational Therapy

## 2018-03-07 ENCOUNTER — Ambulatory Visit: Payer: Medicare HMO | Attending: Optometry | Admitting: Occupational Therapy

## 2018-03-08 ENCOUNTER — Inpatient Hospital Stay (HOSPITAL_COMMUNITY): Payer: Medicare HMO | Admitting: Certified Registered Nurse Anesthetist

## 2018-03-08 ENCOUNTER — Inpatient Hospital Stay (HOSPITAL_COMMUNITY): Payer: Medicare HMO

## 2018-03-08 ENCOUNTER — Other Ambulatory Visit: Payer: Self-pay

## 2018-03-08 ENCOUNTER — Emergency Department (HOSPITAL_COMMUNITY): Payer: Medicare HMO

## 2018-03-08 ENCOUNTER — Encounter (HOSPITAL_COMMUNITY): Payer: Self-pay | Admitting: Emergency Medicine

## 2018-03-08 ENCOUNTER — Encounter (HOSPITAL_COMMUNITY)
Admission: EM | Disposition: A | Discharge status: Home / Self Care | Payer: Self-pay | Source: Home / Self Care | Attending: Cardiovascular Disease

## 2018-03-08 ENCOUNTER — Inpatient Hospital Stay (HOSPITAL_COMMUNITY)
Admission: EM | Admit: 2018-03-08 | Discharge: 2018-03-15 | DRG: 659 | Disposition: A | Discharge status: Home / Self Care | Payer: Medicare HMO | Attending: Cardiovascular Disease | Admitting: Cardiovascular Disease

## 2018-03-08 DIAGNOSIS — M329 Systemic lupus erythematosus, unspecified: Secondary | ICD-10-CM | POA: Diagnosis present

## 2018-03-08 DIAGNOSIS — E669 Obesity, unspecified: Secondary | ICD-10-CM | POA: Diagnosis present

## 2018-03-08 DIAGNOSIS — M797 Fibromyalgia: Secondary | ICD-10-CM | POA: Diagnosis present

## 2018-03-08 DIAGNOSIS — Z87440 Personal history of urinary (tract) infections: Secondary | ICD-10-CM | POA: Diagnosis not present

## 2018-03-08 DIAGNOSIS — I341 Nonrheumatic mitral (valve) prolapse: Secondary | ICD-10-CM | POA: Diagnosis present

## 2018-03-08 DIAGNOSIS — N136 Pyonephrosis: Secondary | ICD-10-CM | POA: Diagnosis present

## 2018-03-08 DIAGNOSIS — I2699 Other pulmonary embolism without acute cor pulmonale: Secondary | ICD-10-CM | POA: Diagnosis present

## 2018-03-08 DIAGNOSIS — D638 Anemia in other chronic diseases classified elsewhere: Secondary | ICD-10-CM | POA: Diagnosis present

## 2018-03-08 DIAGNOSIS — N183 Chronic kidney disease, stage 3 (moderate): Secondary | ICD-10-CM | POA: Diagnosis present

## 2018-03-08 DIAGNOSIS — H409 Unspecified glaucoma: Secondary | ICD-10-CM | POA: Diagnosis present

## 2018-03-08 DIAGNOSIS — N179 Acute kidney failure, unspecified: Secondary | ICD-10-CM | POA: Diagnosis not present

## 2018-03-08 DIAGNOSIS — N200 Calculus of kidney: Secondary | ICD-10-CM

## 2018-03-08 DIAGNOSIS — Z885 Allergy status to narcotic agent status: Secondary | ICD-10-CM

## 2018-03-08 DIAGNOSIS — E8809 Other disorders of plasma-protein metabolism, not elsewhere classified: Secondary | ICD-10-CM | POA: Diagnosis present

## 2018-03-08 DIAGNOSIS — I129 Hypertensive chronic kidney disease with stage 1 through stage 4 chronic kidney disease, or unspecified chronic kidney disease: Secondary | ICD-10-CM | POA: Diagnosis present

## 2018-03-08 DIAGNOSIS — Z6836 Body mass index (BMI) 36.0-36.9, adult: Secondary | ICD-10-CM

## 2018-03-08 DIAGNOSIS — R1031 Right lower quadrant pain: Secondary | ICD-10-CM | POA: Diagnosis present

## 2018-03-08 DIAGNOSIS — R0781 Pleurodynia: Secondary | ICD-10-CM | POA: Diagnosis present

## 2018-03-08 LAB — COMPREHENSIVE METABOLIC PANEL
ALT: 15 U/L (ref 0–44)
AST: 25 U/L (ref 15–41)
Albumin: 2.8 g/dL — ABNORMAL LOW (ref 3.5–5.0)
Alkaline Phosphatase: 91 U/L (ref 38–126)
Anion gap: 9 (ref 5–15)
BILIRUBIN TOTAL: 0.9 mg/dL (ref 0.3–1.2)
BUN: 19 mg/dL (ref 8–23)
CO2: 24 mmol/L (ref 22–32)
CREATININE: 1.45 mg/dL — AB (ref 0.44–1.00)
Calcium: 9.6 mg/dL (ref 8.9–10.3)
Chloride: 105 mmol/L (ref 98–111)
GFR, EST AFRICAN AMERICAN: 38 mL/min — AB (ref >60)
GFR, EST NON AFRICAN AMERICAN: 33 mL/min — AB (ref >60)
Glucose, Bld: 115 mg/dL — ABNORMAL HIGH (ref 70–99)
POTASSIUM: 3.8 mmol/L (ref 3.5–5.1)
Sodium: 138 mmol/L (ref 135–145)
TOTAL PROTEIN: 9.4 g/dL — AB (ref 6.5–8.1)

## 2018-03-08 LAB — CBC
HEMATOCRIT: 33.4 % — AB (ref 36.0–46.0)
Hemoglobin: 10.6 g/dL — ABNORMAL LOW (ref 12.0–15.0)
MCH: 25.3 pg — ABNORMAL LOW (ref 26.0–34.0)
MCHC: 31.7 g/dL (ref 30.0–36.0)
MCV: 79.7 fL (ref 78.0–100.0)
PLATELETS: 368 10*3/uL (ref 150–400)
RBC: 4.19 MIL/uL (ref 3.87–5.11)
RDW: 17.4 % — AB (ref 11.5–15.5)
WBC: 9.3 10*3/uL (ref 4.0–10.5)

## 2018-03-08 LAB — URINALYSIS, ROUTINE W REFLEX MICROSCOPIC
Bilirubin Urine: NEGATIVE
Glucose, UA: NEGATIVE mg/dL
Ketones, ur: NEGATIVE mg/dL
NITRITE: POSITIVE — AB
PH: 5 (ref 5.0–8.0)
Protein, ur: NEGATIVE mg/dL
SPECIFIC GRAVITY, URINE: 1.04 — AB (ref 1.005–1.030)

## 2018-03-08 LAB — ECHOCARDIOGRAM COMPLETE
HEIGHTINCHES: 64 in
WEIGHTICAEL: 3360 [oz_av]

## 2018-03-08 LAB — LIPASE, BLOOD: LIPASE: 22 U/L (ref 11–51)

## 2018-03-08 SURGERY — CYSTOSCOPY, WITH RETROGRADE PYELOGRAM AND URETERAL STENT INSERTION
Anesthesia: General | Laterality: Right

## 2018-03-08 MED ORDER — PROPOFOL 10 MG/ML IV BOLUS
INTRAVENOUS | Status: AC
  Filled 2018-03-08: qty 20

## 2018-03-08 MED ORDER — ONDANSETRON HCL 4 MG/2ML IJ SOLN
4.0000 mg | Freq: Once | INTRAMUSCULAR | Status: AC
  Administered 2018-03-08: 4 mg via INTRAVENOUS
  Filled 2018-03-08: qty 2

## 2018-03-08 MED ORDER — ALBUTEROL SULFATE (2.5 MG/3ML) 0.083% IN NEBU
3.0000 mL | INHALATION_SOLUTION | Freq: Four times a day (QID) | RESPIRATORY_TRACT | Status: DC | PRN
  Administered 2018-03-12: 3 mL via RESPIRATORY_TRACT
  Filled 2018-03-08: qty 3

## 2018-03-08 MED ORDER — HEPARIN SODIUM (PORCINE) 5000 UNIT/ML IJ SOLN
5000.0000 [IU] | Freq: Three times a day (TID) | INTRAMUSCULAR | Status: DC
  Administered 2018-03-08 – 2018-03-09 (×3): 5000 [IU] via SUBCUTANEOUS
  Filled 2018-03-08 (×3): qty 1

## 2018-03-08 MED ORDER — OXYBUTYNIN CHLORIDE ER 5 MG PO TB24
5.0000 mg | ORAL_TABLET | Freq: Every day | ORAL | Status: DC
  Administered 2018-03-08 – 2018-03-15 (×7): 5 mg via ORAL
  Filled 2018-03-08 (×7): qty 1

## 2018-03-08 MED ORDER — IOPAMIDOL (ISOVUE-300) INJECTION 61%
INTRAVENOUS | Status: AC
  Filled 2018-03-08: qty 100

## 2018-03-08 MED ORDER — ONDANSETRON HCL 4 MG/2ML IJ SOLN
INTRAMUSCULAR | Status: AC
  Filled 2018-03-08: qty 2

## 2018-03-08 MED ORDER — MECLIZINE HCL 25 MG PO TABS
12.5000 mg | ORAL_TABLET | Freq: Three times a day (TID) | ORAL | Status: DC
  Administered 2018-03-08 – 2018-03-15 (×19): 12.5 mg via ORAL
  Filled 2018-03-08 (×19): qty 1

## 2018-03-08 MED ORDER — OXYCODONE-ACETAMINOPHEN 5-325 MG PO TABS
1.0000 | ORAL_TABLET | ORAL | Status: DC | PRN
  Administered 2018-03-08 – 2018-03-14 (×8): 1 via ORAL
  Filled 2018-03-08 (×9): qty 1

## 2018-03-08 MED ORDER — DORZOLAMIDE HCL-TIMOLOL MAL 2-0.5 % OP SOLN
1.0000 [drp] | Freq: Two times a day (BID) | OPHTHALMIC | Status: DC
  Administered 2018-03-08 – 2018-03-15 (×13): 1 [drp] via OPHTHALMIC
  Filled 2018-03-08: qty 10

## 2018-03-08 MED ORDER — FENTANYL CITRATE (PF) 100 MCG/2ML IJ SOLN
INTRAMUSCULAR | Status: AC
Start: 2018-03-08 — End: ?
  Filled 2018-03-08: qty 2

## 2018-03-08 MED ORDER — FENTANYL CITRATE (PF) 100 MCG/2ML IJ SOLN
50.0000 ug | Freq: Once | INTRAMUSCULAR | Status: AC
  Administered 2018-03-08: 50 ug via INTRAVENOUS
  Filled 2018-03-08: qty 2

## 2018-03-08 MED ORDER — DEXAMETHASONE SODIUM PHOSPHATE 10 MG/ML IJ SOLN
INTRAMUSCULAR | Status: AC
  Filled 2018-03-08: qty 1

## 2018-03-08 MED ORDER — LIDOCAINE 2% (20 MG/ML) 5 ML SYRINGE
INTRAMUSCULAR | Status: AC
  Filled 2018-03-08: qty 5

## 2018-03-08 MED ORDER — PANTOPRAZOLE SODIUM 40 MG PO TBEC
40.0000 mg | DELAYED_RELEASE_TABLET | Freq: Every day | ORAL | Status: DC
Start: 2018-03-08 — End: 2018-03-15
  Administered 2018-03-08 – 2018-03-15 (×7): 40 mg via ORAL
  Filled 2018-03-08 (×8): qty 1

## 2018-03-08 MED ORDER — SODIUM CHLORIDE 0.9 % IV SOLN
Freq: Once | INTRAVENOUS | Status: AC
  Administered 2018-03-08: 12:00:00 via INTRAVENOUS

## 2018-03-08 MED ORDER — IOPAMIDOL (ISOVUE-300) INJECTION 61%
100.0000 mL | Freq: Once | INTRAVENOUS | Status: AC | PRN
  Administered 2018-03-08: 80 mL via INTRAVENOUS

## 2018-03-08 MED ORDER — LEVOTHYROXINE SODIUM 50 MCG PO TABS
50.0000 ug | ORAL_TABLET | Freq: Every day | ORAL | Status: DC
  Administered 2018-03-09 – 2018-03-15 (×6): 50 ug via ORAL
  Filled 2018-03-08 (×6): qty 1

## 2018-03-08 MED ORDER — LATANOPROST 0.005 % OP SOLN
1.0000 [drp] | Freq: Every day | OPHTHALMIC | Status: DC
  Administered 2018-03-08 – 2018-03-14 (×6): 1 [drp] via OPHTHALMIC
  Filled 2018-03-08 (×2): qty 2.5

## 2018-03-08 MED ORDER — SODIUM CHLORIDE 0.9 % IV SOLN
INTRAVENOUS | Status: DC
  Administered 2018-03-09 – 2018-03-13 (×6): via INTRAVENOUS

## 2018-03-08 MED ORDER — MOMETASONE FURO-FORMOTEROL FUM 100-5 MCG/ACT IN AERO
2.0000 | INHALATION_SPRAY | Freq: Two times a day (BID) | RESPIRATORY_TRACT | Status: DC
  Administered 2018-03-08 – 2018-03-15 (×13): 2 via RESPIRATORY_TRACT
  Filled 2018-03-08: qty 8.8

## 2018-03-08 NOTE — ED Notes (Signed)
Pt has been notified that a urine sample was needed

## 2018-03-08 NOTE — Anesthesia Preprocedure Evaluation (Deleted)
Anesthesia Evaluation    Airway        Dental   Pulmonary COPD,  COPD inhaler,  CT scan of the abdomen/pelvis reveals a 10 mm right UPJ stone with hydronephrosis and a right lower lobe pulmonary embolusStone          Cardiovascular hypertension (not on meds presently),   03/08/18 ECHO: EF 60-65%, valves OK   Neuro/Psych    GI/Hepatic GERD  ,  Endo/Other  Hypothyroidism SLE  Renal/GU Renal InsufficiencyRenal disease (creat 1.45)R stone     Musculoskeletal   Abdominal   Peds  Hematology   Anesthesia Other Findings   Reproductive/Obstetrics                             Anesthesia Physical Anesthesia Plan Anesthesia Quick Evaluation

## 2018-03-08 NOTE — Progress Notes (Signed)
  Echocardiogram 2D Echocardiogram has been performed.  Leta JunglingCooper, Shivan Hodes M 03/08/2018, 3:44 PM

## 2018-03-08 NOTE — H&P (Signed)
Referring Physician:  [name] is an 82 y.o. female.                       Chief Complaint: Abdominal pain  HPI: 82 year old female has 3 week history of Right upper and lower quadrant abdominal pain x 3 weeks. She has noticed dark colored urine. She denies chest pain, shortness of breath, fever or chills. PMH includes SLE, hypertension, mitral valve prolapse, anemia of chronic disease and fibromyalgia.  Past Medical History:  Diagnosis Date  . Fibromyalgia   . Hypertension   . Lupus ([place])   . MVP (mitral valve prolapse)       History reviewed. No pertinent surgical history.  No family history on file. Social History:  reports that she has never smoked. She has never used smokeless tobacco. She reports that she does not drink alcohol or use drugs.  Allergies:  Allergies  Allergen Reactions  . Codeine Other (See Comments)    Unknown reaction     (Not in a hospital admission)  Results for orders placed or performed during the hospital encounter of 03/08/18 (from the past 48 hour(s))  Lipase, blood     Status: None   Collection Time: 03/08/18 10:16 AM  Result Value Ref Range   Lipase 22 11 - 51 U/L    Comment: Performed at [facility], [place] [address] [place], [place] [address]  Comprehensive metabolic panel     Status: Abnormal   Collection Time: 03/08/18 10:16 AM  Result Value Ref Range   Sodium 138 135 - 145 mmol/L   Potassium 3.8 3.5 - 5.1 mmol/L   Chloride 105 98 - 111 mmol/L    Comment: Please note change in reference range.   CO2 24 22 - 32 mmol/L   Glucose, Bld 115 (H) 70 - 99 mg/dL    Comment: Please note change in reference range.   BUN 19 8 - 23 mg/dL    Comment: Please note change in reference range.   Creatinine, Ser 1.45 (H) 0.44 - 1.00 mg/dL   Calcium 9.6 8.9 - 10.3 mg/dL   Total Protein 9.4 (H) 6.5 - 8.1 g/dL   Albumin 2.8 (L) 3.5 - 5.0 g/dL   AST 25 15 - 41 U/L   ALT 15 0 - 44 U/L    Comment: Please note change in  reference range.   Alkaline Phosphatase 91 38 - 126 U/L   Total Bilirubin 0.9 0.3 - 1.2 mg/dL   GFR calc non Af Amer 33 (L) >60 mL/min   GFR calc Af Amer 38 (L) >60 mL/min    Comment: (NOTE) The eGFR has been calculated using the CKD EPI equation. This calculation has not been validated in all clinical situations. eGFR's persistently <60 mL/min signify possible Chronic Kidney Disease.    Anion gap 9 5 - 15    Comment: Performed at [facility], [place] [address], [place],  [address]  CBC     Status: Abnormal   Collection Time: 03/08/18 10:16 AM  Result Value Ref Range   WBC 9.3 4.0 - 10.5 K/uL   RBC 4.19 3.87 - 5.11 MIL/uL   Hemoglobin 10.6 (L) 12.0 - 15.0 g/dL   HCT 33.4 (L) 36.0 - 46.0 %   MCV 79.7 78.0 - 100.0 fL   MCH 25.3 (L) 26.0 - 34.0 pg   MCHC 31.7 30.0 - 36.0 g/dL   RDW 17.4 (H) 11.5 - 15.5 %  Platelets 368 150 - 400 K/uL    Comment: Performed at [facility], [place] [address] [place], [place] [address]   Ct Abdomen Pelvis W Contrast  Result Date: 03/08/2018 CLINICAL DATA:  Right rib pain for 3 weeks.  Right abdominal pain. EXAM: CT ABDOMEN AND PELVIS WITH CONTRAST TECHNIQUE: Multidetector CT imaging of the abdomen and pelvis was performed using the standard protocol following bolus administration of intravenous contrast. CONTRAST:  44m ISOVUE-300 IOPAMIDOL (ISOVUE-300) INJECTION 61% COMPARISON:  None. FINDINGS: Lower chest: Filling defects are noted in the right lower lobe pulmonary artery compatible with pulmonary emboli. Heart is borderline in size. No evidence of right heart strain. Dependent atelectasis in the lower lobes. No effusions. Hepatobiliary: 2.8 cm cyst in the left hepatic lobe. Gallbladder unremarkable. Pancreas: No focal abnormality or ductal dilatation. Spleen: No focal abnormality.  Normal size. Adrenals/Urinary Tract: 10 mm right UPJ stone with moderate severe right hydronephrosis and delayed excretion of contrast  from the right kidney. Nonobstructing stone in the lower pole of the right kidney. Bilateral renal cysts. No hydronephrosis on the left. Adrenal glands and urinary bladder unremarkable. Stomach/Bowel: Left colonic diverticulosis. No active diverticulitis. Appendix is normal. Stomach and small bowel decompressed, unremarkable. Vascular/Lymphatic: No evidence of aneurysm or adenopathy. Aortic atherosclerosis. Reproductive: Prior hysterectomy.  No adnexal masses. Other: No free fluid or free air. Musculoskeletal: No acute bony abnormality. Degenerative changes in lumbar spine. IMPRESSION: Right lower lobe pulmonary emboli partially imaged on this CT abdomen. No evidence for right heart strain. Bibasilar atelectasis. 10 mm obstructing right UPJ stone with moderate to severe right hydronephrosis. Right lower pole nephrolithiasis. Aortic atherosclerosis. Left colonic diverticulosis. These results were called by telephone at the time of interpretation on 03/08/2018 at 12:42 pm to Dr. BNat Christen, who verbally acknowledged these results. Electronically Signed   By: KRolm BaptiseM.D.   On: 03/08/2018 12:44    Review Of Systems Constitutional: No fever, chills, weight loss or gain. Eyes: No vision change, wears glasses. No discharge or pain. Ears: No hearing loss, No tinnitus. Respiratory: Positive asthma, no COPD, pneumonias. Positive shortness of breath. No hemoptysis. Cardiovascular: Occasional chest pain, palpitation, leg edema. Gastrointestinal: Positive nausea, vomiting, constipation. No GI bleed. No hepatitis. Genitourinary: Positive dysuria, positive hematuria, kidney stone and incontinance. Neurological: Positive headache, no stroke, seizures.  Psychiatry: No psych facility admission for anxiety, depression, suicide. No detox. Skin: No rash. Musculoskeletal: Positive joint pain, fibromyalgia, neck pain, back pain. Lymphadenopathy: No lymphadenopathy. Hematology: Positive anemia. No easy  bruising.   Blood pressure (!) 154/74, pulse 74, temperature 98.3 F (36.8 C), temperature source Oral, resp. rate 15, height '5\' 4"'$  (1.626 m), weight 95.3 kg (210 lb), SpO2 100 %. Body mass index is 36.05 kg/m. General appearance: alert, cooperative, appears stated age and no distress Head: Normocephalic, atraumatic. Eyes: Brown eyes, Pale pink conjunctiva, corneas clear. PERRL, EOM's intact. Neck: No adenopathy, no carotid bruit, no JVD, supple, symmetrical, trachea midline and thyroid not enlarged. Resp: Clear to auscultation bilaterally. Cardio: Regular rate and rhythm, S1, S2 normal, II/VI systolic murmur, no click, rub or gallop GI: Soft, Right upper and lower quadrant-tender; bowel sounds normal; no organomegaly. Extremities: No edema, cyanosis or clubbing. Skin: Warm and dry.  Neurologic: Alert and oriented X 3, normal strength. Normal coordination..  Assessment/Plan Acute abdominal pain, RUQ and RLQ Right kidney stone with hydronephrosis SLE Back pain Fibromyalgia Pulmonary embolus Glaucoma Hypertension Anemia of chronic disease Obesity  Admit Urology consult of ECuster MD IV fluids. Hold  IV heparin until hematuria is ruled out/controlled in absence of .hypoxemia or right heart strain. Vascular US of lower legs to r/o DVT  Birdie Riddle, MD  03/08/2018, 3:06 PM

## 2018-03-08 NOTE — ED Provider Notes (Signed)
[facility]-EMERGENCY DEPT Provider Note   CSN: [id number] Arrival date & time: 03/08/18  4098     History   Chief Complaint Chief Complaint  Patient presents with  . Abdominal Pain    HPI Traci Wilson is a 82 y.o. female.  Level 5 caveat for acuity of condition.  Patient presents with right upper quadrant pain radiating to the right flank for 3 weeks (getting worse) without hematuria, dysuria, cough, substernal chest pain, fever, chills.  Symptoms started yesterday.  Past medical history includes SLE, hypertension, mitral valve prolapse.  No history of prolonged travel or immobilization.  Severity of pain is moderate.  Pain is worse with laughing, coughing, burping.  Surgical history includes partial hysterectomy and appendectomy.  She still has her gallbladder.     Past Medical History:  Diagnosis Date  . Fibromyalgia   . Hypertension   . Lupus (HCC)   . MVP (mitral valve prolapse)     Patient Active Problem List   Diagnosis Date Noted  . Dyspnea 05/29/2012  . Hypothyroid 05/29/2012  . Cough 03/06/2012    History reviewed. No pertinent surgical history.   OB History   None      Home Medications    Prior to Admission medications   Medication Sig Start Date End Date Taking? Authorizing Provider  albuterol (PROAIR HFA) 108 (90 BASE) MCG/ACT inhaler Inhale 2 puffs into the lungs every 6 (six) hours as needed for shortness of breath.    Yes [provider]  bimatoprost (LUMIGAN) 0.01 % SOLN Place 1 drop into both eyes daily.   Yes [provider]  Brinzolamide-Brimonidine (SIMBRINZA) 1-0.2 % SUSP Place 1 drop into both eyes 2 (two) times daily.   Yes [provider]  diazepam (VALIUM) 5 MG tablet Take 5 mg by mouth every 6 (six) hours as needed for anxiety.    Yes [provider]  dorzolamide-timolol (COSOPT) 22.3-6.8 MG/ML ophthalmic solution Place 1 drop into both eyes 2 (two) times daily. 03/26/17  Yes  [provider]  levothyroxine (SYNTHROID) 50 MCG tablet Take 1 tablet (50 mcg total) by mouth daily. 05/29/12  Yes Nyoka Cowden, MD  mometasone-formoterol (DULERA) 100-5 MCG/ACT AERO Inhale 2 puffs into the lungs 2 (two) times daily. 03/23/12 03/08/18 Yes Nyoka Cowden, MD  Neomycin-Bacitracin Zn-Polymyx (NEOMYCIN-BACITRACIN-POLYMYXIN OP) Place 1 drop into both ears 3 (three) times daily.   Yes [provider]  omeprazole (PRILOSEC) 20 MG capsule Take 20 mg by mouth daily as needed (heartburn).    Yes [provider]  oxybutynin (DITROPAN-XL) 5 MG 24 hr tablet Take 5 mg by mouth daily.   Yes [provider]  amoxicillin-clavulanate (AUGMENTIN) 875-125 MG tablet Take 1 tablet by mouth 2 (two) times daily.    [provider]  ciprofloxacin (CIPRO) 500 MG tablet Take 500 mg by mouth 2 (two) times daily.    [provider]  meclizine (ANTIVERT) 12.5 MG tablet Take 1 tablet by mouth 3 (three) times daily. 03/30/17   [provider]    Family History No family history on file.  Social History Social History   Tobacco Use  . Smoking status: Never Smoker  . Smokeless tobacco: Never Used  Substance Use Topics  . Alcohol use: No  . Drug use: No     Allergies   Codeine   Review of Systems Review of Systems  Unable to perform ROS: Acuity of condition     Physical Exam Updated Vital  Signs BP (!) 154/74 (BP Location: Right Arm)   Pulse 74   Temp 98.3 F (36.8 C) (Oral)   Resp 15   Ht 5\' 4"  (1.626 m)   Wt 95.3 kg (210 lb)   SpO2 100%   BMI 36.05 kg/m   Physical Exam  Constitutional: She is oriented to person, place, and time. She appears well-developed and well-nourished.  nad  HENT:  Head: Normocephalic and atraumatic.  Eyes: Conjunctivae are normal.  Neck: Neck supple.  Cardiovascular: Normal rate and regular rhythm.  Pulmonary/Chest: Effort normal and breath sounds normal.  Abdominal: Soft. Bowel sounds are  normal.  ruq tenderness  Musculoskeletal: Normal range of motion.  Neurological: She is alert and oriented to person, place, and time.  Skin: Skin is warm and dry.  Psychiatric: She has a normal mood and affect. Her behavior is normal.  Nursing note and vitals reviewed.    ED Treatments / Results  Labs (all labs ordered are listed, but only abnormal results are displayed) Labs Reviewed  COMPREHENSIVE METABOLIC PANEL - Abnormal; Notable for the following components:      Result Value   Glucose, Bld 115 (*)    Creatinine, Ser 1.45 (*)    Total Protein 9.4 (*)    Albumin 2.8 (*)    GFR calc non Af Amer 33 (*)    GFR calc Af Amer 38 (*)    All other components within normal limits  CBC - Abnormal; Notable for the following components:   Hemoglobin 10.6 (*)    HCT 33.4 (*)    MCH 25.3 (*)    RDW 17.4 (*)    All other components within normal limits  LIPASE, BLOOD  URINALYSIS, ROUTINE W REFLEX MICROSCOPIC    EKG None  Radiology Ct Abdomen Pelvis W Contrast  Result Date: 03/08/2018 CLINICAL DATA:  Right rib pain for 3 weeks.  Right abdominal pain. EXAM: CT ABDOMEN AND PELVIS WITH CONTRAST TECHNIQUE: Multidetector CT imaging of the abdomen and pelvis was performed using the standard protocol following bolus administration of intravenous contrast. CONTRAST:  80mL ISOVUE-300 IOPAMIDOL (ISOVUE-300) INJECTION 61% COMPARISON:  None. FINDINGS: Lower chest: Filling defects are noted in the right lower lobe pulmonary artery compatible with pulmonary emboli. Heart is borderline in size. No evidence of right heart strain. Dependent atelectasis in the lower lobes. No effusions. Hepatobiliary: 2.8 cm cyst in the left hepatic lobe. Gallbladder unremarkable. Pancreas: No focal abnormality or ductal dilatation. Spleen: No focal abnormality.  Normal size. Adrenals/Urinary Tract: 10 mm right UPJ stone with moderate severe right hydronephrosis and delayed excretion of contrast from the right kidney.  Nonobstructing stone in the lower pole of the right kidney. Bilateral renal cysts. No hydronephrosis on the left. Adrenal glands and urinary bladder unremarkable. Stomach/Bowel: Left colonic diverticulosis. No active diverticulitis. Appendix is normal. Stomach and small bowel decompressed, unremarkable. Vascular/Lymphatic: No evidence of aneurysm or adenopathy. Aortic atherosclerosis. Reproductive: Prior hysterectomy.  No adnexal masses. Other: No free fluid or free air. Musculoskeletal: No acute bony abnormality. Degenerative changes in lumbar spine. IMPRESSION: Right lower lobe pulmonary emboli partially imaged on this CT abdomen. No evidence for right heart strain. Bibasilar atelectasis. 10 mm obstructing right UPJ stone with moderate to severe right hydronephrosis. Right lower pole nephrolithiasis. Aortic atherosclerosis. Left colonic diverticulosis. These results were called by telephone at the time of interpretation on 03/08/2018 at 12:42 pm to Dr. Donnetta Hutching , who verbally acknowledged these results. Electronically Signed   By: Charlett Nose M.D.  On: 03/08/2018 12:44    Procedures Procedures (including critical care time)  Medications Ordered in ED Medications  iopamidol (ISOVUE-300) 61 % injection (has no administration in time range)  0.9 %  sodium chloride infusion ( Intravenous New Bag/Given 03/08/18 1146)  ondansetron (ZOFRAN) injection 4 mg (4 mg Intravenous Given 03/08/18 1146)  fentaNYL (SUBLIMAZE) injection 50 mcg (50 mcg Intravenous Given 03/08/18 1146)  iopamidol (ISOVUE-300) 61 % injection 100 mL (80 mLs Intravenous Contrast Given 03/08/18 1200)     Initial Impression / Assessment and Plan / ED Course  I have reviewed the triage vital signs and the nursing notes.  Pertinent labs & imaging results that were available during my care of the patient were reviewed by me and considered in my medical decision making (see chart for details).     Patient presents with right upper  quadrant pain without dyspnea, substernal chest pain, dysuria, hematuria.  CT scan of the abdomen/pelvis reveals a 10 mm right UPJ stone with hydronephrosis and a right lower lobe pulmonary embolus.  Patient is hemodynamically stable.  No evidence of respiratory or cardiac compromise.  Discussed with her primary care physician.  He will admit.  Final Clinical Impressions(s) / ED Diagnoses   Final diagnoses:  Right kidney stone  Acute pulmonary embolism without acute cor pulmonale, unspecified pulmonary embolism type [facility](HCC)    ED Discharge Orders    None       Donnetta Hutchingook, Azyiah Bo, MD 03/08/18 1424

## 2018-03-08 NOTE — ED Triage Notes (Signed)
Pt c/o abd pain on right side that started yesterday. Pt denies n/v/d or urinary problems. Reports pain is worse with movement.

## 2018-03-08 NOTE — ED Notes (Signed)
ED TO INPATIENT HANDOFF REPORT  Name/Age/Gender Traci Wilson 82 y.o. female  Code Status    Code Status Orders  (From admission, onward)        Start     Ordered   03/08/18 1456  Full code  Continuous     03/08/18 1458    Code Status History    This patient has a current code status but no historical code status.      Home/SNF/Other Home  Chief Complaint possible UTI   Level of Care/Admitting Diagnosis ED Disposition    ED Disposition Condition Comment   Admit  Hospital Area: [facility] [[id number]]  Level of Care: Med-Surg [16]  Diagnosis: Abdominal pain, acute, right lower quadrant [676195]  Admitting Physician: Dixie Dials [[id number]]  Attending Physician: Dixie Dials [[id number]]  Estimated length of stay: past midnight tomorrow  Certification:: I certify this patient will need inpatient services for at least 2 midnights  PT Class (Do Not Modify): Inpatient [101]  PT Acc Code (Do Not Modify): Private [1]       Medical History Past Medical History:  Diagnosis Date  . Fibromyalgia   . Hypertension   . Lupus ([place])   . MVP (mitral valve prolapse)     Allergies Allergies  Allergen Reactions  . Codeine Other (See Comments)    Unknown reaction    IV Location/Drains/Wounds Patient Lines/Drains/Airways Status   Active Line/Drains/Airways    Name:   Placement date:   Placement time:   Site:   Days:   Peripheral IV 03/08/18 Right Antecubital   03/08/18    1145    Antecubital   less than 1          Labs/Imaging Results for orders placed or performed during the hospital encounter of 03/08/18 (from the past 48 hour(s))  Lipase, blood     Status: None   Collection Time: 03/08/18 10:16 AM  Result Value Ref Range   Lipase 22 11 - 51 U/L    Comment: Performed at [facility], [place] [address], [place], [place] [address]  Comprehensive metabolic panel     Status: Abnormal   Collection Time: 03/08/18 10:16 AM  Result Value  Ref Range   Sodium 138 135 - 145 mmol/L   Potassium 3.8 3.5 - 5.1 mmol/L   Chloride 105 98 - 111 mmol/L    Comment: Please note change in reference range.   CO2 24 22 - 32 mmol/L   Glucose, Bld 115 (H) 70 - 99 mg/dL    Comment: Please note change in reference range.   BUN 19 8 - 23 mg/dL    Comment: Please note change in reference range.   Creatinine, Ser 1.45 (H) 0.44 - 1.00 mg/dL   Calcium 9.6 8.9 - 10.3 mg/dL   Total Protein 9.4 (H) 6.5 - 8.1 g/dL   Albumin 2.8 (L) 3.5 - 5.0 g/dL   AST 25 15 - 41 U/L   ALT 15 0 - 44 U/L    Comment: Please note change in reference range.   Alkaline Phosphatase 91 38 - 126 U/L   Total Bilirubin 0.9 0.3 - 1.2 mg/dL   GFR calc non Af Amer 33 (L) >60 mL/min   GFR calc Af Amer 38 (L) >60 mL/min    Comment: (NOTE) The eGFR has been calculated using the CKD EPI equation. This calculation has not been validated in all clinical situations. eGFR's persistently <60 mL/min signify possible Chronic Kidney Disease.  Anion gap 9 5 - 15    Comment: Performed at [facility], [place] [address] [place], [place] [address]  CBC     Status: Abnormal   Collection Time: 03/08/18 10:16 AM  Result Value Ref Range   WBC 9.3 4.0 - 10.5 K/uL   RBC 4.19 3.87 - 5.11 MIL/uL   Hemoglobin 10.6 (L) 12.0 - 15.0 g/dL   HCT 33.4 (L) 36.0 - 46.0 %   MCV 79.7 78.0 - 100.0 fL   MCH 25.3 (L) 26.0 - 34.0 pg   MCHC 31.7 30.0 - 36.0 g/dL   RDW 17.4 (H) 11.5 - 15.5 %   Platelets 368 150 - 400 K/uL    Comment: Performed at [facility], [place] [address], [place], [place] [address]   Dg Chest 2 View  Result Date: 03/08/2018 CLINICAL DATA:  Acute pulmonary embolus in the right lower lobe based on abdomen CT of 03/08/2018. EXAM: CHEST - 2 VIEW COMPARISON:  03/08/2018 CT abdomen, and chest radiograph from 06/02/2015 FINDINGS: Mild enlargement of the cardiopericardial silhouette. Indistinct airspace opacity in the right lower lobe potentially from  pulmonary hemorrhage related to the known right lower lobe pulmonary embolus. There is obscuration of the left lateral hemidiaphragm compatible with left basilar airspace opacity as well. Thoracic spondylosis noted. IMPRESSION: 1. Bibasilar airspace opacities. The patient has a known right lower lobe acute pulmonary embolus, and pulmonary hemorrhage is certainly a differential diagnostic consideration. 2. Mild enlargement of the cardiopericardial silhouette. 3. Thoracic spondylosis. Electronically Signed   By: Van Clines M.D.   On: 03/08/2018 16:21   Ct Abdomen Pelvis W Contrast  Result Date: 03/08/2018 CLINICAL DATA:  Right rib pain for 3 weeks.  Right abdominal pain. EXAM: CT ABDOMEN AND PELVIS WITH CONTRAST TECHNIQUE: Multidetector CT imaging of the abdomen and pelvis was performed using the standard protocol following bolus administration of intravenous contrast. CONTRAST:  20m ISOVUE-300 IOPAMIDOL (ISOVUE-300) INJECTION 61% COMPARISON:  None. FINDINGS: Lower chest: Filling defects are noted in the right lower lobe pulmonary artery compatible with pulmonary emboli. Heart is borderline in size. No evidence of right heart strain. Dependent atelectasis in the lower lobes. No effusions. Hepatobiliary: 2.8 cm cyst in the left hepatic lobe. Gallbladder unremarkable. Pancreas: No focal abnormality or ductal dilatation. Spleen: No focal abnormality.  Normal size. Adrenals/Urinary Tract: 10 mm right UPJ stone with moderate severe right hydronephrosis and delayed excretion of contrast from the right kidney. Nonobstructing stone in the lower pole of the right kidney. Bilateral renal cysts. No hydronephrosis on the left. Adrenal glands and urinary bladder unremarkable. Stomach/Bowel: Left colonic diverticulosis. No active diverticulitis. Appendix is normal. Stomach and small bowel decompressed, unremarkable. Vascular/Lymphatic: No evidence of aneurysm or adenopathy. Aortic atherosclerosis. Reproductive: Prior  hysterectomy.  No adnexal masses. Other: No free fluid or free air. Musculoskeletal: No acute bony abnormality. Degenerative changes in lumbar spine. IMPRESSION: Right lower lobe pulmonary emboli partially imaged on this CT abdomen. No evidence for right heart strain. Bibasilar atelectasis. 10 mm obstructing right UPJ stone with moderate to severe right hydronephrosis. Right lower pole nephrolithiasis. Aortic atherosclerosis. Left colonic diverticulosis. These results were called by telephone at the time of interpretation on 03/08/2018 at 12:42 pm to Dr. BNat Christen, who verbally acknowledged these results. Electronically Signed   By: KRolm BaptiseM.D.   On: 03/08/2018 12:44    Pending Labs Unresulted Labs (From admission, onward)   Start     Ordered   03/09/18 0500  Basic  metabolic panel  Tomorrow morning,   R     03/08/18 1458   03/09/18 0500  CBC  Tomorrow morning,   R     03/08/18 1458   03/08/18 1454  CBC  (heparin)  Once,   R    Comments:  Baseline for heparin therapy IF NOT ALREADY DRAWN.  Notify MD if PLT < 100 K.    03/08/18 1458   03/08/18 1454  Creatinine, serum  (heparin)  Once,   R    Comments:  Baseline for heparin therapy IF NOT ALREADY DRAWN.    03/08/18 1458   03/08/18 1007  Urinalysis, Routine w reflex microscopic  STAT,   STAT     03/08/18 1006      Vitals/Pain Today's Vitals   03/08/18 1006 03/08/18 1151 03/08/18 1212 03/08/18 1330  BP: (!) 146/73 (!) 142/70  (!) 154/74  Pulse: 85 72  74  Resp: _0 Temp: 98.3 F (36.8 C)     TempSrc: Oral     SpO2: 99% 96%  100%  Weight:      Height:      PainSc:   6      Isolation Precautions No active isolations  Medications Medications  iopamidol (ISOVUE-300) 61 % injection (has no administration in time range)  heparin injection 5,000 Units (has no administration in time range)  albuterol (PROVENTIL HFA;VENTOLIN HFA) 108 (90 Base) MCG/ACT inhaler 2 puff (has no administration in time range)  latanoprost  (XALATAN) 0.005 % ophthalmic solution 1 drop (has no administration in time range)  dorzolamide-timolol (COSOPT) 22.3-6.8 MG/ML ophthalmic solution 1 drop (has no administration in time range)  levothyroxine (SYNTHROID, LEVOTHROID) tablet 50 mcg (has no administration in time range)  meclizine (ANTIVERT) tablet 12.5 mg (has no administration in time range)  mometasone-formoterol (DULERA) 100-5 MCG/ACT inhaler 2 puff (has no administration in time range)  pantoprazole (PROTONIX) EC tablet 40 mg (has no administration in time range)  oxybutynin (DITROPAN-XL) 24 hr tablet 5 mg (has no administration in time range)  oxyCODONE-acetaminophen (PERCOCET/ROXICET) 5-325 MG per tablet 1 tablet (has no administration in time range)  0.9 %  sodium chloride infusion (has no administration in time range)  0.9 %  sodium chloride infusion ( Intravenous Stopped 03/08/18 1346)  ondansetron (ZOFRAN) injection 4 mg (4 mg Intravenous Given 03/08/18 1146)  fentaNYL (SUBLIMAZE) injection 50 mcg (50 mcg Intravenous Given 03/08/18 1146)  iopamidol (ISOVUE-300) 61 % injection 100 mL (80 mLs Intravenous Contrast Given 03/08/18 1200)    Mobility walks

## 2018-03-08 NOTE — ED Notes (Signed)
Patient states she cannot urinate at this time

## 2018-03-08 NOTE — Progress Notes (Signed)
Physician on the unit to assess patient prior to surgery. Patients surgery has been cancelled for now.

## 2018-03-08 NOTE — Progress Notes (Signed)
Patient received to unit at approximately staff received called to get patient ready for the OR. Patient prepped for the OR.

## 2018-03-08 NOTE — H&P (View-Only) (Signed)
Urology Consult Note   Requesting Attending Physician:  Orpah Cobb, MD Service Providing Consult: Urology  Consulting Attending: Jerilee Field   Reason for Consult:  Nephrolithiasis  HPI: Traci Wilson is seen in consultation for reasons noted above at the request of Orpah Cobb, MD for evaluation of nephrolithiasis.  This is a 82 y.o. female with HTN, SLE, MVP presenting with right sided pain, found to have 1cm right UPJ stone as well as evidence of incompletely evaluated RLL pulmonary emboli.  Patient reports two weeks of right sided pain that acutely worsened last night. She presented to the ED today for evaluation. She denies fevers/chills, nausea/vomiting, hematuria, dysuria or concern for UTI. She does report urinary frequency/urgency that is baseline. She has had UTIs in the past denies typical symptoms of UTI at this time.  No prior history of stone disease. Aside from prior UTIs, she denies any urologic history.   Past Medical History: Past Medical History:  Diagnosis Date  . Fibromyalgia   . Hypertension   . Lupus (HCC)   . MVP (mitral valve prolapse)     Past Surgical History:  History reviewed. No pertinent surgical history.  Medication: Current Facility-Administered Medications  Medication Dose Route Frequency Provider Last Rate Last Dose  . 0.9 %  sodium chloride infusion   Intravenous Continuous Orpah Cobb, MD      . albuterol (PROVENTIL) (2.5 MG/3ML) 0.083% nebulizer solution 3 mL  3 mL Inhalation Q6H PRN Orpah Cobb, MD      . dorzolamide-timolol (COSOPT) 22.3-6.8 MG/ML ophthalmic solution 1 drop  1 drop Both Eyes BID Orpah Cobb, MD      . heparin injection 5,000 Units  5,000 Units Subcutaneous Q8H Kadakia, Ajay, MD      . iopamidol (ISOVUE-300) 61 % injection           . latanoprost (XALATAN) 0.005 % ophthalmic solution 1 drop  1 drop Both Eyes QHS Orpah Cobb, MD      . Melene Muller ON 03/09/2018] levothyroxine (SYNTHROID, LEVOTHROID) tablet 50  mcg  50 mcg Oral QAC breakfast Orpah Cobb, MD      . meclizine (ANTIVERT) tablet 12.5 mg  12.5 mg Oral TID Orpah Cobb, MD      . mometasone-formoterol (DULERA) 100-5 MCG/ACT inhaler 2 puff  2 puff Inhalation BID Orpah Cobb, MD      . oxybutynin (DITROPAN-XL) 24 hr tablet 5 mg  5 mg Oral Daily Orpah Cobb, MD      . oxyCODONE-acetaminophen (PERCOCET/ROXICET) 5-325 MG per tablet 1 tablet  1 tablet Oral Q4H PRN Orpah Cobb, MD      . pantoprazole (PROTONIX) EC tablet 40 mg  40 mg Oral Daily Orpah Cobb, MD        Allergies: Allergies  Allergen Reactions  . Codeine Other (See Comments)    Unknown reaction    Social History: Social History   Tobacco Use  . Smoking status: Never Smoker  . Smokeless tobacco: Never Used  Substance Use Topics  . Alcohol use: No  . Drug use: No    Family History History reviewed. No pertinent family history.  Review of Systems 10 systems were reviewed and are negative except as noted specifically in the HPI.  Objective   Vital signs in last 24 hours: BP (!) 156/80 (BP Location: Right Arm)   Pulse 75   Temp 98 F (36.7 C) (Oral)   Resp 16   Ht 5\' 4"  (1.626 m)   Wt 95.3 kg (210 lb)  SpO2 99%   BMI 36.05 kg/m   Physical Exam General: NAD, A&O, resting, appropriate HEENT: [place]/AT, EOMI, MMM Pulmonary: Normal work of breathing, RUQ pain with deep inspiration Cardiovascular: HDS, adequate peripheral perfusion Abdomen: Soft, NTTP, nondistended GU: RUQ/flank pain with deep palpation Extremities: warm and well perfused Neuro: Appropriate, no focal neurological deficits  Most Recent Labs: Lab Results  Component Value Date   WBC 9.3 03/08/2018   HGB 10.6 (L) 03/08/2018   HCT 33.4 (L) 03/08/2018   PLT 368 03/08/2018    Lab Results  Component Value Date   NA 138 03/08/2018   K 3.8 03/08/2018   CL 105 03/08/2018   CO2 24 03/08/2018   BUN 19 03/08/2018   CREATININE 1.45 (H) 03/08/2018   CALCIUM 9.6 03/08/2018    No  results found for: INR, APTT   IMAGING: Dg Chest 2 View  Result Date: 03/08/2018 CLINICAL DATA:  Acute pulmonary embolus in the right lower lobe based on abdomen CT of 03/08/2018. EXAM: CHEST - 2 VIEW COMPARISON:  03/08/2018 CT abdomen, and chest radiograph from 06/02/2015 FINDINGS: Mild enlargement of the cardiopericardial silhouette. Indistinct airspace opacity in the right lower lobe potentially from pulmonary hemorrhage related to the known right lower lobe pulmonary embolus. There is obscuration of the left lateral hemidiaphragm compatible with left basilar airspace opacity as well. Thoracic spondylosis noted. IMPRESSION: 1. Bibasilar airspace opacities. The patient has a known right lower lobe acute pulmonary embolus, and pulmonary hemorrhage is certainly a differential diagnostic consideration. 2. Mild enlargement of the cardiopericardial silhouette. 3. Thoracic spondylosis. Electronically Signed   By: Gaylyn RongWalter  Liebkemann M.D.   On: 03/08/2018 16:21   Ct Abdomen Pelvis W Contrast  Result Date: 03/08/2018 CLINICAL DATA:  Right rib pain for 3 weeks.  Right abdominal pain. EXAM: CT ABDOMEN AND PELVIS WITH CONTRAST TECHNIQUE: Multidetector CT imaging of the abdomen and pelvis was performed using the standard protocol following bolus administration of intravenous contrast. CONTRAST:  80mL ISOVUE-300 IOPAMIDOL (ISOVUE-300) INJECTION 61% COMPARISON:  None. FINDINGS: Lower chest: Filling defects are noted in the right lower lobe pulmonary artery compatible with pulmonary emboli. Heart is borderline in size. No evidence of right heart strain. Dependent atelectasis in the lower lobes. No effusions. Hepatobiliary: 2.8 cm cyst in the left hepatic lobe. Gallbladder unremarkable. Pancreas: No focal abnormality or ductal dilatation. Spleen: No focal abnormality.  Normal size. Adrenals/Urinary Tract: 10 mm right UPJ stone with moderate severe right hydronephrosis and delayed excretion of contrast from the right  kidney. Nonobstructing stone in the lower pole of the right kidney. Bilateral renal cysts. No hydronephrosis on the left. Adrenal glands and urinary bladder unremarkable. Stomach/Bowel: Left colonic diverticulosis. No active diverticulitis. Appendix is normal. Stomach and small bowel decompressed, unremarkable. Vascular/Lymphatic: No evidence of aneurysm or adenopathy. Aortic atherosclerosis. Reproductive: Prior hysterectomy.  No adnexal masses. Other: No free fluid or free air. Musculoskeletal: No acute bony abnormality. Degenerative changes in lumbar spine. IMPRESSION: Right lower lobe pulmonary emboli partially imaged on this CT abdomen. No evidence for right heart strain. Bibasilar atelectasis. 10 mm obstructing right UPJ stone with moderate to severe right hydronephrosis. Right lower pole nephrolithiasis. Aortic atherosclerosis. Left colonic diverticulosis. These results were called by telephone at the time of interpretation on 03/08/2018 at 12:42 pm to Dr. Donnetta HutchingBRIAN COOK , who verbally acknowledged these results. Electronically Signed   By: Charlett NoseKevin  Dover M.D.   On: 03/08/2018 12:44    ------  Assessment:  82 y.o. female with significant cardiac history presenting with right  sided pain found to have 1cm stone at the right UPJ and moderate hydronephrosis. Patient also with non-obstructing right lower pole stone. Patient also found to have incompletely imaged right lower lobe PE with workup that is ongoing.  Patient currently AF with stable vital signs. Non-toxic appearing. No leukocytosis. UA pending. No symptoms to suggest UTI. Cr 1.45.   Recommendations: - Discussed options at this time include observation with continue monitoring of symptoms, vital signs and lab work versus proceeding with intervention in the way of right ureteral stent placement versus right nephrostomy tube. Risks and benefits of each were discussed in the context of her newly and incompletely evaluated PEs. We do feel that  observation is a safe option given the fact that she is clinically stable without signs of systemic infection. Patient and family agree and prefer observation at this time. - KUB ordered, if stone not readily apparent on x-ray patient may benefit from urine alkalinization in effort to dissolve the stone - Recommend continuing with PO pain medication for symptom control, hard to know if pain secondary to stone, PE or a combination of the two. - Continue to trend Cr and monitor UOP - Please contact urology if patient were to show clinical signs/symptoms of systemic infection as this may prompt more urgent consideration for renal decompression - Follow up results of urinalysis - Urology will continue to follow   Thank you for this consult. Please contact the urology consult pager with any further questions/concerns.

## 2018-03-08 NOTE — Consult Note (Signed)
Urology Consult Note   Requesting Attending Physician:  Orpah Cobb, MD Service Providing Consult: Urology  Consulting Attending: Jerilee Field   Reason for Consult:  Nephrolithiasis  HPI: Traci Wilson is seen in consultation for reasons noted above at the request of Orpah Cobb, MD for evaluation of nephrolithiasis.  This is a 82 y.o. female with HTN, SLE, MVP presenting with right sided pain, found to have 1cm right UPJ stone as well as evidence of incompletely evaluated RLL pulmonary emboli.  Patient reports two weeks of right sided pain that acutely worsened last night. She presented to the ED today for evaluation. She denies fevers/chills, nausea/vomiting, hematuria, dysuria or concern for UTI. She does report urinary frequency/urgency that is baseline. She has had UTIs in the past denies typical symptoms of UTI at this time.  No prior history of stone disease. Aside from prior UTIs, she denies any urologic history.   Past Medical History: Past Medical History:  Diagnosis Date  . Fibromyalgia   . Hypertension   . Lupus (HCC)   . MVP (mitral valve prolapse)     Past Surgical History:  History reviewed. No pertinent surgical history.  Medication: Current Facility-Administered Medications  Medication Dose Route Frequency Provider Last Rate Last Dose  . 0.9 %  sodium chloride infusion   Intravenous Continuous Orpah Cobb, MD      . albuterol (PROVENTIL) (2.5 MG/3ML) 0.083% nebulizer solution 3 mL  3 mL Inhalation Q6H PRN Orpah Cobb, MD      . dorzolamide-timolol (COSOPT) 22.3-6.8 MG/ML ophthalmic solution 1 drop  1 drop Both Eyes BID Orpah Cobb, MD      . heparin injection 5,000 Units  5,000 Units Subcutaneous Q8H Kadakia, Ajay, MD      . iopamidol (ISOVUE-300) 61 % injection           . latanoprost (XALATAN) 0.005 % ophthalmic solution 1 drop  1 drop Both Eyes QHS Orpah Cobb, MD      . Melene Muller ON 03/09/2018] levothyroxine (SYNTHROID, LEVOTHROID) tablet 50  mcg  50 mcg Oral QAC breakfast Orpah Cobb, MD      . meclizine (ANTIVERT) tablet 12.5 mg  12.5 mg Oral TID Orpah Cobb, MD      . mometasone-formoterol (DULERA) 100-5 MCG/ACT inhaler 2 puff  2 puff Inhalation BID Orpah Cobb, MD      . oxybutynin (DITROPAN-XL) 24 hr tablet 5 mg  5 mg Oral Daily Orpah Cobb, MD      . oxyCODONE-acetaminophen (PERCOCET/ROXICET) 5-325 MG per tablet 1 tablet  1 tablet Oral Q4H PRN Orpah Cobb, MD      . pantoprazole (PROTONIX) EC tablet 40 mg  40 mg Oral Daily Orpah Cobb, MD        Allergies: Allergies  Allergen Reactions  . Codeine Other (See Comments)    Unknown reaction    Social History: Social History   Tobacco Use  . Smoking status: Never Smoker  . Smokeless tobacco: Never Used  Substance Use Topics  . Alcohol use: No  . Drug use: No    Family History History reviewed. No pertinent family history.  Review of Systems 10 systems were reviewed and are negative except as noted specifically in the HPI.  Objective   Vital signs in last 24 hours: BP (!) 156/80 (BP Location: Right Arm)   Pulse 75   Temp 98 F (36.7 C) (Oral)   Resp 16   Ht 5\' 4"  (1.626 m)   Wt 95.3 kg (210 lb)  SpO2 99%   BMI 36.05 kg/m   Physical Exam General: NAD, A&O, resting, appropriate HEENT: [place]/AT, EOMI, MMM Pulmonary: Normal work of breathing, RUQ pain with deep inspiration Cardiovascular: HDS, adequate peripheral perfusion Abdomen: Soft, NTTP, nondistended GU: RUQ/flank pain with deep palpation Extremities: warm and well perfused Neuro: Appropriate, no focal neurological deficits  Most Recent Labs: Lab Results  Component Value Date   WBC 9.3 03/08/2018   HGB 10.6 (L) 03/08/2018   HCT 33.4 (L) 03/08/2018   PLT 368 03/08/2018    Lab Results  Component Value Date   NA 138 03/08/2018   K 3.8 03/08/2018   CL 105 03/08/2018   CO2 24 03/08/2018   BUN 19 03/08/2018   CREATININE 1.45 (H) 03/08/2018   CALCIUM 9.6 03/08/2018    No  results found for: INR, APTT   IMAGING: Dg Chest 2 View  Result Date: 03/08/2018 CLINICAL DATA:  Acute pulmonary embolus in the right lower lobe based on abdomen CT of 03/08/2018. EXAM: CHEST - 2 VIEW COMPARISON:  03/08/2018 CT abdomen, and chest radiograph from 06/02/2015 FINDINGS: Mild enlargement of the cardiopericardial silhouette. Indistinct airspace opacity in the right lower lobe potentially from pulmonary hemorrhage related to the known right lower lobe pulmonary embolus. There is obscuration of the left lateral hemidiaphragm compatible with left basilar airspace opacity as well. Thoracic spondylosis noted. IMPRESSION: 1. Bibasilar airspace opacities. The patient has a known right lower lobe acute pulmonary embolus, and pulmonary hemorrhage is certainly a differential diagnostic consideration. 2. Mild enlargement of the cardiopericardial silhouette. 3. Thoracic spondylosis. Electronically Signed   By: Gaylyn RongWalter  Liebkemann M.D.   On: 03/08/2018 16:21   Ct Abdomen Pelvis W Contrast  Result Date: 03/08/2018 CLINICAL DATA:  Right rib pain for 3 weeks.  Right abdominal pain. EXAM: CT ABDOMEN AND PELVIS WITH CONTRAST TECHNIQUE: Multidetector CT imaging of the abdomen and pelvis was performed using the standard protocol following bolus administration of intravenous contrast. CONTRAST:  80mL ISOVUE-300 IOPAMIDOL (ISOVUE-300) INJECTION 61% COMPARISON:  None. FINDINGS: Lower chest: Filling defects are noted in the right lower lobe pulmonary artery compatible with pulmonary emboli. Heart is borderline in size. No evidence of right heart strain. Dependent atelectasis in the lower lobes. No effusions. Hepatobiliary: 2.8 cm cyst in the left hepatic lobe. Gallbladder unremarkable. Pancreas: No focal abnormality or ductal dilatation. Spleen: No focal abnormality.  Normal size. Adrenals/Urinary Tract: 10 mm right UPJ stone with moderate severe right hydronephrosis and delayed excretion of contrast from the right  kidney. Nonobstructing stone in the lower pole of the right kidney. Bilateral renal cysts. No hydronephrosis on the left. Adrenal glands and urinary bladder unremarkable. Stomach/Bowel: Left colonic diverticulosis. No active diverticulitis. Appendix is normal. Stomach and small bowel decompressed, unremarkable. Vascular/Lymphatic: No evidence of aneurysm or adenopathy. Aortic atherosclerosis. Reproductive: Prior hysterectomy.  No adnexal masses. Other: No free fluid or free air. Musculoskeletal: No acute bony abnormality. Degenerative changes in lumbar spine. IMPRESSION: Right lower lobe pulmonary emboli partially imaged on this CT abdomen. No evidence for right heart strain. Bibasilar atelectasis. 10 mm obstructing right UPJ stone with moderate to severe right hydronephrosis. Right lower pole nephrolithiasis. Aortic atherosclerosis. Left colonic diverticulosis. These results were called by telephone at the time of interpretation on 03/08/2018 at 12:42 pm to Dr. Donnetta HutchingBRIAN COOK , who verbally acknowledged these results. Electronically Signed   By: Charlett NoseKevin  Dover M.D.   On: 03/08/2018 12:44    ------  Assessment:  82 y.o. female with significant cardiac history presenting with right  sided pain found to have 1cm stone at the right UPJ and moderate hydronephrosis. Patient also with non-obstructing right lower pole stone. Patient also found to have incompletely imaged right lower lobe PE with workup that is ongoing.  Patient currently AF with stable vital signs. Non-toxic appearing. No leukocytosis. UA pending. No symptoms to suggest UTI. Cr 1.45.   Recommendations: - Discussed options at this time include observation with continue monitoring of symptoms, vital signs and lab work versus proceeding with intervention in the way of right ureteral stent placement versus right nephrostomy tube. Risks and benefits of each were discussed in the context of her newly and incompletely evaluated PEs. We do feel that  observation is a safe option given the fact that she is clinically stable without signs of systemic infection. Patient and family agree and prefer observation at this time. - KUB ordered, if stone not readily apparent on x-ray patient may benefit from urine alkalinization in effort to dissolve the stone - Recommend continuing with PO pain medication for symptom control, hard to know if pain secondary to stone, PE or a combination of the two. - Continue to trend Cr and monitor UOP - Please contact urology if patient were to show clinical signs/symptoms of systemic infection as this may prompt more urgent consideration for renal decompression - Follow up results of urinalysis - Urology will continue to follow   Thank you for this consult. Please contact the urology consult pager with any further questions/concerns.

## 2018-03-09 ENCOUNTER — Inpatient Hospital Stay (HOSPITAL_COMMUNITY): Payer: Medicare HMO

## 2018-03-09 DIAGNOSIS — I2699 Other pulmonary embolism without acute cor pulmonale: Secondary | ICD-10-CM

## 2018-03-09 LAB — CBC
HEMATOCRIT: 29.7 % — AB (ref 36.0–46.0)
HEMOGLOBIN: 9.3 g/dL — AB (ref 12.0–15.0)
MCH: 25.3 pg — AB (ref 26.0–34.0)
MCHC: 31.3 g/dL (ref 30.0–36.0)
MCV: 80.9 fL (ref 78.0–100.0)
Platelets: 332 10*3/uL (ref 150–400)
RBC: 3.67 MIL/uL — ABNORMAL LOW (ref 3.87–5.11)
RDW: 17.6 % — ABNORMAL HIGH (ref 11.5–15.5)
WBC: 7 10*3/uL (ref 4.0–10.5)

## 2018-03-09 LAB — APTT: aPTT: 45 seconds — ABNORMAL HIGH (ref 24–36)

## 2018-03-09 LAB — BASIC METABOLIC PANEL
ANION GAP: 7 (ref 5–15)
BUN: 18 mg/dL (ref 8–23)
CALCIUM: 8.6 mg/dL — AB (ref 8.9–10.3)
CO2: 23 mmol/L (ref 22–32)
Chloride: 106 mmol/L (ref 98–111)
Creatinine, Ser: 1.28 mg/dL — ABNORMAL HIGH (ref 0.44–1.00)
GFR, EST AFRICAN AMERICAN: 45 mL/min — AB (ref >60)
GFR, EST NON AFRICAN AMERICAN: 38 mL/min — AB (ref >60)
Glucose, Bld: 104 mg/dL — ABNORMAL HIGH (ref 70–99)
POTASSIUM: 3.9 mmol/L (ref 3.5–5.1)
Sodium: 136 mmol/L (ref 135–145)

## 2018-03-09 LAB — PROTIME-INR
INR: 1.17
Prothrombin Time: 14.9 seconds (ref 11.4–15.2)

## 2018-03-09 LAB — HEPARIN LEVEL (UNFRACTIONATED): Heparin Unfractionated: 0.46 IU/mL (ref 0.30–0.70)

## 2018-03-09 MED ORDER — HEPARIN BOLUS VIA INFUSION
2200.0000 [IU] | INTRAVENOUS | Status: AC
  Administered 2018-03-09: 2200 [IU] via INTRAVENOUS
  Filled 2018-03-09: qty 2200

## 2018-03-09 MED ORDER — HEPARIN (PORCINE) IN NACL 100-0.45 UNIT/ML-% IJ SOLN
1200.0000 [IU]/h | INTRAMUSCULAR | Status: DC
  Administered 2018-03-09 – 2018-03-13 (×4): 1200 [IU]/h via INTRAVENOUS
  Filled 2018-03-09 (×7): qty 250

## 2018-03-09 MED ORDER — STERILE WATER FOR INJECTION IV SOLN
INTRAVENOUS | Status: DC
  Administered 2018-03-09: 16:00:00 via INTRAVENOUS
  Filled 2018-03-09 (×2): qty 850

## 2018-03-09 NOTE — Progress Notes (Signed)
ANTICOAGULATION CONSULT NOTE - Initial Consult  Pharmacy Consult for Heparin Indication: pulmonary embolus  Allergies  Allergen Reactions  . Codeine Other (See Comments)    Unknown reaction    Patient Measurements: Height: 5\' 4"  (162.6 cm) Weight: 210 lb (95.3 kg) IBW/kg (Calculated) : 54.7 Heparin Dosing Weight: 76.4 kg  Vital Signs: Temp: 98.7 F (37.1 C) (07/19 1240) Temp Source: Oral (07/19 1240) BP: 126/57 (07/19 1240) Pulse Rate: 69 (07/19 1240)  Labs: Recent Labs    03/08/18 1016 03/09/18 0424  HGB 10.6* 9.3*  HCT 33.4* 29.7*  PLT 368 332  CREATININE 1.45* 1.28*    Estimated Creatinine Clearance: 39.2 mL/min (A) (by C-G formula based on SCr of 1.28 mg/dL (H)).   Medical History: Past Medical History:  Diagnosis Date  . Fibromyalgia   . Hypertension   . Lupus (HCC)   . MVP (mitral valve prolapse)     Medications:  Infusions:  . sodium chloride    .  sodium bicarbonate (isotonic) infusion in sterile water      Assessment: Traci Wilson admitted on 7/18 with abdominal pain, dark colored urine.  PMH of SLE, HTN, mitral valve prolapse, anemia, and fibromyalgia. She was found to have nephrolithiasis (urology surgery deferred at this time) and CT abdomen shows partially imaged RLL PE.  Doppers negative for DVT.  Pharmacy is consulted on 7/19 for Heparin IV for PE.  SCr 1.28 (admit 1.45) CBC: Hgb down to 9.3, Plt 332 Baseline coags pending  Goal of Therapy:  Heparin level 0.3-0.7 units/ml Monitor platelets by anticoagulation protocol: Yes   Plan:   Give heparin 2200 units bolus IV x 1  Start heparin IV infusion at 1200 units/hr  Heparin level 8 hours after starting  Daily heparin level and CBC   Lynann Beaverhristine Remedy Corporan PharmD, BCPS Pager [phone] 03/09/2018 2:31 PM

## 2018-03-09 NOTE — Consult Note (Signed)
Urology Consult Note   Requesting Attending Physician:  Orpah CobbKadakia, Ajay, MD Service Providing Consult: Urology  Consulting Attending: Jerilee FieldMatthew Eskridge   Reason for Consult:  Nephrolithiasis  Subjective/Interval Events: NAEON AFVSS No leukocytosis Cr 1.28 from 1.45 yesterday PE/DVT workup still ongoing Patient continues to endorse RUQ pain, worse with palpation and deep inspiration. She is more comfortable today with the use of PO medication.    Past Medical History: Past Medical History:  Diagnosis Date  . Fibromyalgia   . Hypertension   . Lupus (HCC)   . MVP (mitral valve prolapse)     Past Surgical History:  History reviewed. No pertinent surgical history.  Medication: Current Facility-Administered Medications  Medication Dose Route Frequency Provider Last Rate Last Dose  . 0.9 %  sodium chloride infusion   Intravenous Continuous Orpah CobbKadakia, Ajay, MD      . albuterol (PROVENTIL) (2.5 MG/3ML) 0.083% nebulizer solution 3 mL  3 mL Inhalation Q6H PRN Orpah CobbKadakia, Ajay, MD      . dorzolamide-timolol (COSOPT) 22.3-6.8 MG/ML ophthalmic solution 1 drop  1 drop Both Eyes BID Orpah CobbKadakia, Ajay, MD   1 drop at 03/09/18 1043  . heparin injection 5,000 Units  5,000 Units Subcutaneous Q8H Orpah CobbKadakia, Ajay, MD   5,000 Units at 03/09/18 1042  . latanoprost (XALATAN) 0.005 % ophthalmic solution 1 drop  1 drop Both Eyes QHS Orpah CobbKadakia, Ajay, MD   1 drop at 03/08/18 2046  . levothyroxine (SYNTHROID, LEVOTHROID) tablet 50 mcg  50 mcg Oral QAC breakfast Orpah CobbKadakia, Ajay, MD   50 mcg at 03/09/18 0844  . meclizine (ANTIVERT) tablet 12.5 mg  12.5 mg Oral TID Orpah CobbKadakia, Ajay, MD   12.5 mg at 03/09/18 1042  . mometasone-formoterol (DULERA) 100-5 MCG/ACT inhaler 2 puff  2 puff Inhalation BID Orpah CobbKadakia, Ajay, MD   2 puff at 03/09/18 0724  . oxybutynin (DITROPAN-XL) 24 hr tablet 5 mg  5 mg Oral Daily Orpah CobbKadakia, Ajay, MD   5 mg at 03/09/18 1042  . oxyCODONE-acetaminophen (PERCOCET/ROXICET) 5-325 MG per tablet 1 tablet  1 tablet  Oral Q4H PRN Orpah CobbKadakia, Ajay, MD   1 tablet at 03/09/18 1055  . pantoprazole (PROTONIX) EC tablet 40 mg  40 mg Oral Daily Orpah CobbKadakia, Ajay, MD   40 mg at 03/09/18 1055    Allergies: Allergies  Allergen Reactions  . Codeine Other (See Comments)    Unknown reaction    Social History: Social History   Tobacco Use  . Smoking status: Never Smoker  . Smokeless tobacco: Never Used  Substance Use Topics  . Alcohol use: No  . Drug use: No    Family History History reviewed. No pertinent family history.  Review of Systems 10 systems were reviewed and are negative except as noted specifically in the HPI.  Objective   Vital signs in last 24 hours: BP (!) 170/77 (BP Location: Right Arm)   Pulse 73   Temp 98.4 F (36.9 C) (Oral)   Resp 16   Ht 5\' 4"  (1.626 m)   Wt 95.3 kg (210 lb)   SpO2 100%   BMI 36.05 kg/m   Physical Exam General: NAD, A&O, resting, appropriate HEENT: /AT, EOMI, MMM Pulmonary: Normal work of breathing, RUQ pain with deep inspiration Cardiovascular: HDS, adequate peripheral perfusion Abdomen: Soft, NTTP, nondistended GU: RUQ/flank pain with deep palpation Extremities: warm and well perfused Neuro: Appropriate, no focal neurological deficits  Most Recent Labs: Lab Results  Component Value Date   WBC 7.0 03/09/2018   HGB 9.3 (L) 03/09/2018  HCT 29.7 (L) 03/09/2018   PLT 332 03/09/2018    Lab Results  Component Value Date   NA 136 03/09/2018   K 3.9 03/09/2018   CL 106 03/09/2018   CO2 23 03/09/2018   BUN 18 03/09/2018   CREATININE 1.28 (H) 03/09/2018   CALCIUM 8.6 (L) 03/09/2018    No results found for: INR, APTT   IMAGING: Dg Chest 2 View  Result Date: 03/08/2018 CLINICAL DATA:  Acute pulmonary embolus in the right lower lobe based on abdomen CT of 03/08/2018. EXAM: CHEST - 2 VIEW COMPARISON:  03/08/2018 CT abdomen, and chest radiograph from 06/02/2015 FINDINGS: Mild enlargement of the cardiopericardial silhouette. Indistinct airspace  opacity in the right lower lobe potentially from pulmonary hemorrhage related to the known right lower lobe pulmonary embolus. There is obscuration of the left lateral hemidiaphragm compatible with left basilar airspace opacity as well. Thoracic spondylosis noted. IMPRESSION: 1. Bibasilar airspace opacities. The patient has a known right lower lobe acute pulmonary embolus, and pulmonary hemorrhage is certainly a differential diagnostic consideration. 2. Mild enlargement of the cardiopericardial silhouette. 3. Thoracic spondylosis. Electronically Signed   By: Gaylyn Rong M.D.   On: 03/08/2018 16:21   Dg Abd 1 View  Result Date: 03/08/2018 CLINICAL DATA:  Right abdomen pain, nephrolithiasis EXAM: ABDOMEN - 1 VIEW COMPARISON:  CT scan March 08, 2018 FINDINGS: The CT noted right UPJ stone is not as well seen on the x-ray. On the pelvic film, the right UPJ stone may project just above the pelvic brim. Residual contrast in the bladder is identified. Extensive bowel content is noted throughout colon. IMPRESSION: The CT noted right UPJ stone is not as well seen on x-ray. On the pelvis film, the right UPJ stone may project just above the pelvic brim. Electronically Signed   By: Sherian Rein M.D.   On: 03/08/2018 19:04   Ct Abdomen Pelvis W Contrast  Result Date: 03/08/2018 CLINICAL DATA:  Right rib pain for 3 weeks.  Right abdominal pain. EXAM: CT ABDOMEN AND PELVIS WITH CONTRAST TECHNIQUE: Multidetector CT imaging of the abdomen and pelvis was performed using the standard protocol following bolus administration of intravenous contrast. CONTRAST:  80mL ISOVUE-300 IOPAMIDOL (ISOVUE-300) INJECTION 61% COMPARISON:  None. FINDINGS: Lower chest: Filling defects are noted in the right lower lobe pulmonary artery compatible with pulmonary emboli. Heart is borderline in size. No evidence of right heart strain. Dependent atelectasis in the lower lobes. No effusions. Hepatobiliary: 2.8 cm cyst in the left hepatic lobe.  Gallbladder unremarkable. Pancreas: No focal abnormality or ductal dilatation. Spleen: No focal abnormality.  Normal size. Adrenals/Urinary Tract: 10 mm right UPJ stone with moderate severe right hydronephrosis and delayed excretion of contrast from the right kidney. Nonobstructing stone in the lower pole of the right kidney. Bilateral renal cysts. No hydronephrosis on the left. Adrenal glands and urinary bladder unremarkable. Stomach/Bowel: Left colonic diverticulosis. No active diverticulitis. Appendix is normal. Stomach and small bowel decompressed, unremarkable. Vascular/Lymphatic: No evidence of aneurysm or adenopathy. Aortic atherosclerosis. Reproductive: Prior hysterectomy.  No adnexal masses. Other: No free fluid or free air. Musculoskeletal: No acute bony abnormality. Degenerative changes in lumbar spine. IMPRESSION: Right lower lobe pulmonary emboli partially imaged on this CT abdomen. No evidence for right heart strain. Bibasilar atelectasis. 10 mm obstructing right UPJ stone with moderate to severe right hydronephrosis. Right lower pole nephrolithiasis. Aortic atherosclerosis. Left colonic diverticulosis. These results were called by telephone at the time of interpretation on 03/08/2018 at 12:42 pm to Dr. Arlys John  COOK , who verbally acknowledged these results. Electronically Signed   By: Charlett Nose M.D.   On: 03/08/2018 12:44    ------  Assessment:  82 y.o. female with significant cardiac history presenting with right sided pain found to have 1cm stone at the right UPJ and moderate hydronephrosis. Patient also with non-obstructing right lower pole stone. Patient also found to have incompletely imaged right lower lobe PE with workup that is ongoing.  Patient remains AF with stable vital signs. Non-toxic appearing. No leukocytosis. UA pending. No symptoms to suggest UTI. Cr improved to 1.28.  This morning we again discussed options at this time include observation with continue monitoring of  symptoms, vital signs and lab work versus proceeding with renal decompression in the way of right ureteral stent placement versus right nephrostomy tube. She continues to prefer observation until her PE/DVT workup has been completed.   Recommendations: - No immediate plans for urologic intervention at this time. Would ideally defer stone management to outpatient.  - Given that stone is not readily apparent on x-ray patient may benefit from urine alkalinization in effort to dissolve the stone if her Cr continues to improve. - Recommend continuing with PO pain medication for symptom control - Continue to trend Cr and monitor UOP - Please contact urology if patient were to show clinical signs/symptoms of systemic infection as this may prompt more urgent consideration for renal decompression - Urology will continue to follow   Thank you for this consult. Please contact the urology consult pager with any further questions/concerns.

## 2018-03-09 NOTE — Progress Notes (Signed)
LE venous duplex prelim: negative for DVT. Ilanna Deihl Eunice, RDMS, RVT  

## 2018-03-09 NOTE — Progress Notes (Signed)
Ref: Traci Dials, MD   Subjective:  Right upper and lower abdominal pain continues. Afebrile. Needs urine alkalinization for urinary stones.  Renal Surgery postponed. Will start IV heparin for now for PE.  Patient denied shortness of breath yesterday but now admits to shortness of breath on exertion on further questioning.  Echocardiogram with mild RV systolic dysfunction. Lower leg [place] negative for DVT.  Objective:  Vital Signs in the last 24 hours: Temp:  [98 F (36.7 C)-98.7 F (37.1 C)] 98.7 F (37.1 C) (07/19 1240) Pulse Rate:  [69-75] 69 (07/19 1240) Resp:  [16-17] 16 (07/19 1240) BP: (126-170)/(57-80) 126/57 (07/19 1240) SpO2:  [95 %-100 %] 98 % (07/19 1240)  Physical Exam: BP Readings from Last 1 Encounters:  03/09/18 (!) 126/57     Wt Readings from Last 1 Encounters:  03/08/18 95.3 kg (210 lb)    Weight change:  Body mass index is 36.05 kg/m. HEENT: [place]/AT, Eyes-Brown, PERL, EOMI, Conjunctiva-Pink, Sclera-Non-icteric Neck: No JVD, No bruit, Trachea midline. Lungs:  Clear, Bilateral. Cardiac:  Regular rhythm, normal S1 and S2, no S3. II/VI systolic murmur. Abdomen:  Soft, Lumbar, RUQ and RLQ areas are tender. BS present. Extremities:  No edema present. No cyanosis. No clubbing. CNS: AxOx3, Cranial nerves grossly intact, moves all 4 extremities.  Skin: Warm and dry.   Intake/Output from previous day: 07/18 0701 - 07/19 0700 In: -  Out: 500 [Urine:500]    Lab Results: BMET    Component Value Date/Time   NA 136 03/09/2018 0424   NA 138 03/08/2018 1016   NA 138 05/29/2012 1147   K 3.9 03/09/2018 0424   K 3.8 03/08/2018 1016   K 3.7 05/29/2012 1147   CL 106 03/09/2018 0424   CL 105 03/08/2018 1016   CL 104 05/29/2012 1147   CO2 23 03/09/2018 0424   CO2 24 03/08/2018 1016   CO2 26 05/29/2012 1147   GLUCOSE 104 (H) 03/09/2018 0424   GLUCOSE 115 (H) 03/08/2018 1016   GLUCOSE 103 (H) 05/29/2012 1147   BUN 18 03/09/2018 0424   BUN 19 03/08/2018 1016    BUN 10 05/29/2012 1147   CREATININE 1.28 (H) 03/09/2018 0424   CREATININE 1.45 (H) 03/08/2018 1016   CREATININE 1.0 05/29/2012 1147   CALCIUM 8.6 (L) 03/09/2018 0424   CALCIUM 9.6 03/08/2018 1016   CALCIUM 9.0 05/29/2012 1147   GFRNONAA 38 (L) 03/09/2018 0424   GFRNONAA 33 (L) 03/08/2018 1016   GFRNONAA >60 08/27/2007 1236   GFRAA 45 (L) 03/09/2018 0424   GFRAA 38 (L) 03/08/2018 1016   GFRAA  08/27/2007 1236    >60        The eGFR has been calculated using the MDRD equation. This calculation has not been validated in all clinical situations. eGFR's persistently <60 mL/min signify possible Chronic Kidney Disease.   CBC    Component Value Date/Time   WBC 7.0 03/09/2018 0424   RBC 3.67 (L) 03/09/2018 0424   HGB 9.3 (L) 03/09/2018 0424   HCT 29.7 (L) 03/09/2018 0424   PLT 332 03/09/2018 0424   MCV 80.9 03/09/2018 0424   MCH 25.3 (L) 03/09/2018 0424   MCHC 31.3 03/09/2018 0424   RDW 17.6 (H) 03/09/2018 0424   LYMPHSABS 4.9 (H) 05/29/2012 1147   MONOABS 0.8 05/29/2012 1147   EOSABS 0.2 05/29/2012 1147   BASOSABS 0.1 05/29/2012 1147   HEPATIC Function Panel Recent Labs    03/08/18 1016  PROT 9.4*   HEMOGLOBIN A1C No components found  for: HGA1C,  MPG CARDIAC ENZYMES No results found for: CKTOTAL, CKMB, CKMBINDEX, TROPONINI BNP No results for input(s): PROBNP in the last 8760 hours. TSH No results for input(s): TSH in the last 8760 hours. CHOLESTEROL No results for input(s): CHOL in the last 8760 hours.  Scheduled Meds: . dorzolamide-timolol  1 drop Both Eyes BID  . latanoprost  1 drop Both Eyes QHS  . levothyroxine  50 mcg Oral QAC breakfast  . meclizine  12.5 mg Oral TID  . mometasone-formoterol  2 puff Inhalation BID  . oxybutynin  5 mg Oral Daily  . pantoprazole  40 mg Oral Daily   Continuous Infusions: . sodium chloride    .  sodium bicarbonate (isotonic) infusion in sterile water     PRN Meds:.albuterol,  oxyCODONE-acetaminophen  Assessment/Plan: Acute RUQ and RLQ abdominal pain Right kidney stone with hydronephrosis Systemic Lupus Back pain Fibromyalgia Pulmonary embolus Glaucoma Hypertension Anemia of chronic disease Obesity  Appreciate urology consult. IV sodium bicarbonate followed by PO sodium bicarbonate IV fluids. IV heparin per pharmacy PO analgesics. Follow BUN/Cr. If not improving or worsening re-consult nephrology.   LOS: 1 day    Traci Dials  MD  03/09/2018, 2:43 PM

## 2018-03-10 LAB — CBC
HEMATOCRIT: 29.6 % — AB (ref 36.0–46.0)
HEMOGLOBIN: 9.1 g/dL — AB (ref 12.0–15.0)
MCH: 24.8 pg — AB (ref 26.0–34.0)
MCHC: 30.7 g/dL (ref 30.0–36.0)
MCV: 80.7 fL (ref 78.0–100.0)
Platelets: 343 10*3/uL (ref 150–400)
RBC: 3.67 MIL/uL — ABNORMAL LOW (ref 3.87–5.11)
RDW: 17.4 % — ABNORMAL HIGH (ref 11.5–15.5)
WBC: 7.4 10*3/uL (ref 4.0–10.5)

## 2018-03-10 LAB — HEPARIN LEVEL (UNFRACTIONATED): Heparin Unfractionated: 0.51 IU/mL (ref 0.30–0.70)

## 2018-03-10 NOTE — Progress Notes (Signed)
ANTICOAGULATION CONSULT NOTE   Pharmacy Consult for Heparin Indication: pulmonary embolus  Allergies  Allergen Reactions  . Codeine Other (See Comments)    Unknown reaction    Patient Measurements: Height: 5\' 4"  (162.6 cm) Weight: 210 lb (95.3 kg) IBW/kg (Calculated) : 54.7 Heparin Dosing Weight: 76.4 kg  Vital Signs: Temp: 98.8 F (37.1 C) (07/19 2041) Temp Source: Oral (07/19 2041) BP: 131/83 (07/19 2041) Pulse Rate: 84 (07/19 2041)  Labs: Recent Labs    03/08/18 1016 03/09/18 0424 03/09/18 1454 03/09/18 2259  HGB 10.6* 9.3*  --   --   HCT 33.4* 29.7*  --   --   PLT 368 332  --   --   APTT  --   --  45*  --   LABPROT  --   --  14.9  --   INR  --   --  1.17  --   HEPARINUNFRC  --   --   --  0.46  CREATININE 1.45* 1.28*  --   --     Estimated Creatinine Clearance: 39.2 mL/min (A) (by C-G formula based on SCr of 1.28 mg/dL (H)).   Medical History: Past Medical History:  Diagnosis Date  . Fibromyalgia   . Hypertension   . Lupus (HCC)   . MVP (mitral valve prolapse)     Medications:  Infusions:  . sodium chloride 50 mL/hr at 03/09/18 1900  . heparin 1,200 Units/hr (03/09/18 1542)  .  sodium bicarbonate (isotonic) infusion in sterile water 50 mL/hr at 03/09/18 1541    Assessment: 82 yoF admitted on 7/18 with abdominal pain, dark colored urine.  PMH of SLE, HTN, mitral valve prolapse, anemia, and fibromyalgia. She was found to have nephrolithiasis (urology surgery deferred at this time) and CT abdomen shows partially imaged RLL PE.  [place] negative for DVT.  Pharmacy is consulted on 7/19 for Heparin IV for PE.  03/10/2018 HL 0.46 therapeutic Baseline coags WNL No bleeding or other issues per RN  Goal of Therapy:  Heparin level 0.3-0.7 units/ml Monitor platelets by anticoagulation protocol: Yes   Plan:   continue heparin IV infusion at 1200 units/hr  heparin level and CBC with am labs   Arley Phenixllen Cariann Kinnamon RPh 03/10/2018, 12:08 AM Pager  [phone]

## 2018-03-10 NOTE — Progress Notes (Signed)
ANTICOAGULATION CONSULT NOTE   Pharmacy Consult for Heparin Indication: pulmonary embolus  Allergies  Allergen Reactions  . Codeine Other (See Comments)    Unknown reaction    Patient Measurements: Height: 5\' 4"  (162.6 cm) Weight: 210 lb (95.3 kg) IBW/kg (Calculated) : 54.7 Heparin Dosing Weight: 76.4 kg  Vital Signs: Temp: 98.4 F (36.9 C) (07/20 0400) Temp Source: Oral (07/20 0400) BP: 133/68 (07/20 0400) Pulse Rate: 92 (07/20 0400)  Labs: Recent Labs    03/08/18 1016 03/09/18 0424 03/09/18 1454 03/09/18 2259 03/10/18 0352  HGB 10.6* 9.3*  --   --  9.1*  HCT 33.4* 29.7*  --   --  29.6*  PLT 368 332  --   --  343  APTT  --   --  45*  --   --   LABPROT  --   --  14.9  --   --   INR  --   --  1.17  --   --   HEPARINUNFRC  --   --   --  0.46 0.51  CREATININE 1.45* 1.28*  --   --   --     Estimated Creatinine Clearance: 39.2 mL/min (A) (by C-G formula based on SCr of 1.28 mg/dL (H)).   Medical History: Past Medical History:  Diagnosis Date  . Fibromyalgia   . Hypertension   . Lupus (HCC)   . MVP (mitral valve prolapse)     Medications:  Infusions:  . sodium chloride 50 mL/hr at 03/09/18 1900  . heparin 1,200 Units/hr (03/09/18 1542)  .  sodium bicarbonate (isotonic) infusion in sterile water 50 mL/hr at 03/09/18 1541    Assessment: Traci Wilson admitted on 7/18 with abdominal pain, dark colored urine.  PMH of SLE, HTN, mitral valve prolapse, anemia, and fibromyalgia. She was found to have nephrolithiasis (urology surgery deferred at this time) and CT abdomen shows partially imaged RLL PE.  Doppers negative for DVT.  Pharmacy is consulted on 7/19 for Heparin IV for PE.  03/10/2018 HL 0.51 therapeutic (confirmatory) Hgb down Plts WNL No bleeding or other issues per RN  Goal of Therapy:  Heparin level 0.3-0.7 units/ml Monitor platelets by anticoagulation protocol: Yes   Plan:   continue heparin IV infusion at 1200 units/hr  Daily heparin level and CBC     Arley PhenixEllen Stewart Pimenta RPh 03/10/2018, 5:00 AM Pager [phone]

## 2018-03-10 NOTE — Progress Notes (Signed)
Urology Inpatient Progress Report  Pulmonary embolus (HCC) [I26.99] Right kidney stone [N20.0] Acute pulmonary embolism without acute cor pulmonale, unspecified pulmonary embolism type (HCC) [I26.99]  Procedure(s): CYSTOSCOPY WITH RETROGRADE PYELOGRAM/URETERAL STENT PLACEMENT  2 Days Post-Op   Intv/Subj: Patient continues to have significant right flank pain.  She is afebrile with vital signs stable.  She is actually doing well on room air currently.  She continues on heparin.  Continues to have some pain with deep inspiration but this is underneath her ribs.  She is having significant flank discomfort.  She was eating this morning.  Active Problems:   Abdominal pain, acute, right lower quadrant  Current Facility-Administered Medications  Medication Dose Route Frequency Provider Last Rate Last Dose  . 0.9 %  sodium chloride infusion   Intravenous Continuous Orpah Cobb, MD 50 mL/hr at 03/09/18 1900    . albuterol (PROVENTIL) (2.5 MG/3ML) 0.083% nebulizer solution 3 mL  3 mL Inhalation Q6H PRN Orpah Cobb, MD      . dorzolamide-timolol (COSOPT) 22.3-6.8 MG/ML ophthalmic solution 1 drop  1 drop Both Eyes BID Orpah Cobb, MD   1 drop at 03/10/18 1037  . heparin ADULT infusion 100 units/mL (25000 units/235mL sodium chloride 0.45%)  1,200 Units/hr Intravenous Continuous Shade, Christine E, RPH 12 mL/hr at 03/10/18 1037 1,200 Units/hr at 03/10/18 1037  . latanoprost (XALATAN) 0.005 % ophthalmic solution 1 drop  1 drop Both Eyes QHS Orpah Cobb, MD   1 drop at 03/09/18 2136  . levothyroxine (SYNTHROID, LEVOTHROID) tablet 50 mcg  50 mcg Oral QAC breakfast Orpah Cobb, MD   50 mcg at 03/10/18 1036  . meclizine (ANTIVERT) tablet 12.5 mg  12.5 mg Oral TID Orpah Cobb, MD   12.5 mg at 03/10/18 1036  . mometasone-formoterol (DULERA) 100-5 MCG/ACT inhaler 2 puff  2 puff Inhalation BID Orpah Cobb, MD   2 puff at 03/10/18 0930  . oxybutynin (DITROPAN-XL) 24 hr tablet 5 mg  5 mg Oral Daily  Orpah Cobb, MD   5 mg at 03/10/18 1036  . oxyCODONE-acetaminophen (PERCOCET/ROXICET) 5-325 MG per tablet 1 tablet  1 tablet Oral Q4H PRN Orpah Cobb, MD   1 tablet at 03/10/18 0159  . pantoprazole (PROTONIX) EC tablet 40 mg  40 mg Oral Daily Orpah Cobb, MD   40 mg at 03/10/18 1036  . sodium bicarbonate 150 mEq in sterile water 1,000 mL infusion   Intravenous Continuous Orpah Cobb, MD 50 mL/hr at 03/09/18 1541       Objective: Vital: Vitals:   03/09/18 2138 03/10/18 0400 03/10/18 0930 03/10/18 0932  BP:  133/68    Pulse:  92    Resp:  20    Temp:  98.4 F (36.9 C)    TempSrc:  Oral    SpO2: 98% 99% 94% 94%  Weight:      Height:       I/Os: I/O last 3 completed shifts: In: 2126.8 [P.O.:960; I.V.:1166.8] Out: 300 [Urine:300]  Physical Exam:  General: Patient is in no apparent distress Lungs: Normal respiratory effort, chest expands symmetrically. GI: The abdomen is soft and nontender without mass. Genitourinary: She has right-sided CVA tenderness Ext: lower extremities symmetric  Lab Results: Recent Labs    03/08/18 1016 03/09/18 0424 03/10/18 0352  WBC 9.3 7.0 7.4  HGB 10.6* 9.3* 9.1*  HCT 33.4* 29.7* 29.6*   Recent Labs    03/08/18 1016 03/09/18 0424  NA 138 136  K 3.8 3.9  CL 105 106  CO2 24 23  GLUCOSE 115* 104*  BUN 19 18  CREATININE 1.45* 1.28*  CALCIUM 9.6 8.6*   Recent Labs    03/09/18 1454  INR 1.17   No results for input(s): LABURIN in the last 72 hours. Results for orders placed or performed during the hospital encounter of 03/31/17  Urine culture     Status: Abnormal   Collection Time: 03/31/17  4:07 AM  Result Value Ref Range Status   Specimen Description URINE, RANDOM  Final   Special Requests NONE  Final   Culture MULTIPLE SPECIES PRESENT, SUGGEST RECOLLECTION (A)  Final   Report Status 04/01/2017 FINAL  Final    Studies/Results: Dg Chest 2 View  Result Date: 03/08/2018 CLINICAL DATA:  Acute pulmonary embolus in the  right lower lobe based on abdomen CT of 03/08/2018. EXAM: CHEST - 2 VIEW COMPARISON:  03/08/2018 CT abdomen, and chest radiograph from 06/02/2015 FINDINGS: Mild enlargement of the cardiopericardial silhouette. Indistinct airspace opacity in the right lower lobe potentially from pulmonary hemorrhage related to the known right lower lobe pulmonary embolus. There is obscuration of the left lateral hemidiaphragm compatible with left basilar airspace opacity as well. Thoracic spondylosis noted. IMPRESSION: 1. Bibasilar airspace opacities. The patient has a known right lower lobe acute pulmonary embolus, and pulmonary hemorrhage is certainly a differential diagnostic consideration. 2. Mild enlargement of the cardiopericardial silhouette. 3. Thoracic spondylosis. Electronically Signed   By: Gaylyn RongWalter  Liebkemann M.D.   On: 03/08/2018 16:21   Dg Abd 1 View  Result Date: 03/08/2018 CLINICAL DATA:  Right abdomen pain, nephrolithiasis EXAM: ABDOMEN - 1 VIEW COMPARISON:  CT scan March 08, 2018 FINDINGS: The CT noted right UPJ stone is not as well seen on the x-ray. On the pelvic film, the right UPJ stone may project just above the pelvic brim. Residual contrast in the bladder is identified. Extensive bowel content is noted throughout colon. IMPRESSION: The CT noted right UPJ stone is not as well seen on x-ray. On the pelvis film, the right UPJ stone may project just above the pelvic brim. Electronically Signed   By: Sherian ReinWei-Chen  Lin M.D.   On: 03/08/2018 19:04   Ct Abdomen Pelvis W Contrast  Result Date: 03/08/2018 CLINICAL DATA:  Right rib pain for 3 weeks.  Right abdominal pain. EXAM: CT ABDOMEN AND PELVIS WITH CONTRAST TECHNIQUE: Multidetector CT imaging of the abdomen and pelvis was performed using the standard protocol following bolus administration of intravenous contrast. CONTRAST:  80mL ISOVUE-300 IOPAMIDOL (ISOVUE-300) INJECTION 61% COMPARISON:  None. FINDINGS: Lower chest: Filling defects are noted in the right lower  lobe pulmonary artery compatible with pulmonary emboli. Heart is borderline in size. No evidence of right heart strain. Dependent atelectasis in the lower lobes. No effusions. Hepatobiliary: 2.8 cm cyst in the left hepatic lobe. Gallbladder unremarkable. Pancreas: No focal abnormality or ductal dilatation. Spleen: No focal abnormality.  Normal size. Adrenals/Urinary Tract: 10 mm right UPJ stone with moderate severe right hydronephrosis and delayed excretion of contrast from the right kidney. Nonobstructing stone in the lower pole of the right kidney. Bilateral renal cysts. No hydronephrosis on the left. Adrenal glands and urinary bladder unremarkable. Stomach/Bowel: Left colonic diverticulosis. No active diverticulitis. Appendix is normal. Stomach and small bowel decompressed, unremarkable. Vascular/Lymphatic: No evidence of aneurysm or adenopathy. Aortic atherosclerosis. Reproductive: Prior hysterectomy.  No adnexal masses. Other: No free fluid or free air. Musculoskeletal: No acute bony abnormality. Degenerative changes in lumbar spine. IMPRESSION: Right lower lobe pulmonary emboli partially imaged on this CT abdomen. No evidence for  right heart strain. Bibasilar atelectasis. 10 mm obstructing right UPJ stone with moderate to severe right hydronephrosis. Right lower pole nephrolithiasis. Aortic atherosclerosis. Left colonic diverticulosis. These results were called by telephone at the time of interpretation on 03/08/2018 at 12:42 pm to Dr. Donnetta Hutching , who verbally acknowledged these results. Electronically Signed   By: Charlett Nose M.D.   On: 03/08/2018 12:44    Assessment: Right ureteral calculus Right flank pain Hydronephrosis  Plan: Plan for right ureteral stent in the morning.  She understands potential risks and complications including but not limited to bleeding, infection, injury to surrounding structures.  Once she has recovered from this, she can then undergo a ureteroscopy with laser lithotripsy  and ureteral stent placement at a later date.   Modena Slater, MD Urology 03/10/2018, 11:07 AM

## 2018-03-10 NOTE — Progress Notes (Signed)
Subjective:  Complains of right-sided pleuritic chest pain.  No hemoptysis.  Denies shortness of breath.  Objective:  Vital Signs in the last 24 hours: Temp:  [98.4 F (36.9 C)-98.8 F (37.1 C)] 98.4 F (36.9 C) (07/20 0400) Pulse Rate:  [69-92] 92 (07/20 0400) Resp:  [16-20] 20 (07/20 0400) BP: (126-133)/(57-83) 133/68 (07/20 0400) SpO2:  [94 %-99 %] 94 % (07/20 0932)  Intake/Output from previous day: 07/19 0701 - 07/20 0700 In: 2126.8 [P.O.:960; I.V.:1166.8] Out: -  Intake/Output from this shift: No intake/output data recorded.  Physical Exam: Neck: no adenopathy, no carotid bruit, no JVD and supple, symmetrical, trachea midline Lungs: clear to auscultation bilaterally Heart: regular rate and rhythm, S1, S2 normal and 2/6 systolic murmur noted Abdomen: soft, non-tender; bowel sounds normal; no masses,  no organomegaly Extremities: extremities normal, atraumatic, no cyanosis or edema  Lab Results: Recent Labs    03/09/18 0424 03/10/18 0352  WBC 7.0 7.4  HGB 9.3* 9.1*  PLT 332 343   Recent Labs    03/08/18 1016 03/09/18 0424  NA 138 136  K 3.8 3.9  CL 105 106  CO2 24 23  GLUCOSE 115* 104*  BUN 19 18  CREATININE 1.45* 1.28*   No results for input(s): TROPONINI in the last 72 hours.  Invalid input(s): CK, MB Hepatic Function Panel Recent Labs    03/08/18 1016  PROT 9.4*  ALBUMIN 2.8*  AST 25  ALT 15  ALKPHOS 91  BILITOT 0.9   No results for input(s): CHOL in the last 72 hours. No results for input(s): PROTIME in the last 72 hours.  Imaging: Imaging results have been reviewed and Dg Chest 2 View  Result Date: 03/08/2018 CLINICAL DATA:  Acute pulmonary embolus in the right lower lobe based on abdomen CT of 03/08/2018. EXAM: CHEST - 2 VIEW COMPARISON:  03/08/2018 CT abdomen, and chest radiograph from 06/02/2015 FINDINGS: Mild enlargement of the cardiopericardial silhouette. Indistinct airspace opacity in the right lower lobe potentially from pulmonary  hemorrhage related to the known right lower lobe pulmonary embolus. There is obscuration of the left lateral hemidiaphragm compatible with left basilar airspace opacity as well. Thoracic spondylosis noted. IMPRESSION: 1. Bibasilar airspace opacities. The patient has a known right lower lobe acute pulmonary embolus, and pulmonary hemorrhage is certainly a differential diagnostic consideration. 2. Mild enlargement of the cardiopericardial silhouette. 3. Thoracic spondylosis. Electronically Signed   By: Gaylyn Rong M.D.   On: 03/08/2018 16:21   Dg Abd 1 View  Result Date: 03/08/2018 CLINICAL DATA:  Right abdomen pain, nephrolithiasis EXAM: ABDOMEN - 1 VIEW COMPARISON:  CT scan March 08, 2018 FINDINGS: The CT noted right UPJ stone is not as well seen on the x-ray. On the pelvic film, the right UPJ stone may project just above the pelvic brim. Residual contrast in the bladder is identified. Extensive bowel content is noted throughout colon. IMPRESSION: The CT noted right UPJ stone is not as well seen on x-ray. On the pelvis film, the right UPJ stone may project just above the pelvic brim. Electronically Signed   By: Sherian Rein M.D.   On: 03/08/2018 19:04   Ct Abdomen Pelvis W Contrast  Result Date: 03/08/2018 CLINICAL DATA:  Right rib pain for 3 weeks.  Right abdominal pain. EXAM: CT ABDOMEN AND PELVIS WITH CONTRAST TECHNIQUE: Multidetector CT imaging of the abdomen and pelvis was performed using the standard protocol following bolus administration of intravenous contrast. CONTRAST:  80mL ISOVUE-300 IOPAMIDOL (ISOVUE-300) INJECTION 61% COMPARISON:  None.  FINDINGS: Lower chest: Filling defects are noted in the right lower lobe pulmonary artery compatible with pulmonary emboli. Heart is borderline in size. No evidence of right heart strain. Dependent atelectasis in the lower lobes. No effusions. Hepatobiliary: 2.8 cm cyst in the left hepatic lobe. Gallbladder unremarkable. Pancreas: No focal abnormality or  ductal dilatation. Spleen: No focal abnormality.  Normal size. Adrenals/Urinary Tract: 10 mm right UPJ stone with moderate severe right hydronephrosis and delayed excretion of contrast from the right kidney. Nonobstructing stone in the lower pole of the right kidney. Bilateral renal cysts. No hydronephrosis on the left. Adrenal glands and urinary bladder unremarkable. Stomach/Bowel: Left colonic diverticulosis. No active diverticulitis. Appendix is normal. Stomach and small bowel decompressed, unremarkable. Vascular/Lymphatic: No evidence of aneurysm or adenopathy. Aortic atherosclerosis. Reproductive: Prior hysterectomy.  No adnexal masses. Other: No free fluid or free air. Musculoskeletal: No acute bony abnormality. Degenerative changes in lumbar spine. IMPRESSION: Right lower lobe pulmonary emboli partially imaged on this CT abdomen. No evidence for right heart strain. Bibasilar atelectasis. 10 mm obstructing right UPJ stone with moderate to severe right hydronephrosis. Right lower pole nephrolithiasis. Aortic atherosclerosis. Left colonic diverticulosis. These results were called by telephone at the time of interpretation on 03/08/2018 at 12:42 pm to Dr. Donnetta HutchingBRIAN COOK , who verbally acknowledged these results. Electronically Signed   By: Charlett NoseKevin  Dover M.D.   On: 03/08/2018 12:44    Cardiac Studies:  Assessment/Plan:  Right pulmonary embolism. Right kidney stone with hydronephrosis. Systemic Lupus Back pain Fibromyalgia Pulmonary embolus Glaucoma Hypertension Anemia of chronic disease Obesity plan Continue present management.  Urology following Check labs in a.m.    LOS: 2 days    Rinaldo CloudHarwani, Storey Stangeland 03/10/2018, 10:37 AM

## 2018-03-11 ENCOUNTER — Encounter (HOSPITAL_COMMUNITY): Payer: Self-pay | Admitting: Anesthesiology

## 2018-03-11 ENCOUNTER — Inpatient Hospital Stay (HOSPITAL_COMMUNITY): Payer: Medicare HMO | Admitting: Anesthesiology

## 2018-03-11 ENCOUNTER — Inpatient Hospital Stay (HOSPITAL_COMMUNITY): Payer: Medicare HMO

## 2018-03-11 ENCOUNTER — Encounter (HOSPITAL_COMMUNITY)
Admission: EM | Disposition: A | Discharge status: Home / Self Care | Payer: Self-pay | Source: Home / Self Care | Attending: Cardiovascular Disease

## 2018-03-11 HISTORY — PX: CYSTOSCOPY WITH STENT PLACEMENT: SHX5790

## 2018-03-11 LAB — CBC
HCT: 29.1 % — ABNORMAL LOW (ref 36.0–46.0)
Hemoglobin: 9.3 g/dL — ABNORMAL LOW (ref 12.0–15.0)
MCH: 24.9 pg — AB (ref 26.0–34.0)
MCHC: 32 g/dL (ref 30.0–36.0)
MCV: 77.8 fL — ABNORMAL LOW (ref 78.0–100.0)
PLATELETS: ADEQUATE 10*3/uL (ref 150–400)
RBC: 3.74 MIL/uL — AB (ref 3.87–5.11)
RDW: 17.1 % — ABNORMAL HIGH (ref 11.5–15.5)
WBC: 7 10*3/uL (ref 4.0–10.5)

## 2018-03-11 LAB — BASIC METABOLIC PANEL
Anion gap: 7 (ref 5–15)
BUN: 20 mg/dL (ref 8–23)
CALCIUM: 8.6 mg/dL — AB (ref 8.9–10.3)
CO2: 28 mmol/L (ref 22–32)
CREATININE: 1.71 mg/dL — AB (ref 0.44–1.00)
Chloride: 101 mmol/L (ref 98–111)
GFR calc Af Amer: 31 mL/min — ABNORMAL LOW (ref >60)
GFR, EST NON AFRICAN AMERICAN: 27 mL/min — AB (ref >60)
Glucose, Bld: 93 mg/dL (ref 70–99)
POTASSIUM: 3.7 mmol/L (ref 3.5–5.1)
SODIUM: 136 mmol/L (ref 135–145)

## 2018-03-11 LAB — HEPARIN LEVEL (UNFRACTIONATED): Heparin Unfractionated: 0.5 IU/mL (ref 0.30–0.70)

## 2018-03-11 LAB — SURGICAL PCR SCREEN
MRSA, PCR: NEGATIVE
Staphylococcus aureus: NEGATIVE

## 2018-03-11 SURGERY — CYSTOSCOPY, WITH STENT INSERTION
Anesthesia: General | Laterality: Right

## 2018-03-11 MED ORDER — SODIUM CHLORIDE 0.9 % IR SOLN
Status: DC | PRN
  Administered 2018-03-11: 3000 mL via INTRAVESICAL

## 2018-03-11 MED ORDER — IOPAMIDOL (ISOVUE-300) INJECTION 61%
INTRAVENOUS | Status: AC
  Filled 2018-03-11: qty 50

## 2018-03-11 MED ORDER — CEFAZOLIN SODIUM-DEXTROSE 2-4 GM/100ML-% IV SOLN
INTRAVENOUS | Status: AC
  Filled 2018-03-11: qty 100

## 2018-03-11 MED ORDER — PROPOFOL 10 MG/ML IV BOLUS
INTRAVENOUS | Status: DC | PRN
  Administered 2018-03-11: 100 mg via INTRAVENOUS
  Administered 2018-03-11: 20 mg via INTRAVENOUS

## 2018-03-11 MED ORDER — LACTATED RINGERS IV SOLN
INTRAVENOUS | Status: DC | PRN
  Administered 2018-03-11: 08:00:00 via INTRAVENOUS

## 2018-03-11 MED ORDER — PROPOFOL 10 MG/ML IV BOLUS
INTRAVENOUS | Status: AC
  Filled 2018-03-11: qty 20

## 2018-03-11 MED ORDER — 0.9 % SODIUM CHLORIDE (POUR BTL) OPTIME
TOPICAL | Status: DC | PRN
  Administered 2018-03-11: 1000 mL

## 2018-03-11 MED ORDER — LIDOCAINE HCL (CARDIAC) PF 100 MG/5ML IV SOSY
PREFILLED_SYRINGE | INTRAVENOUS | Status: DC | PRN
  Administered 2018-03-11: 40 mg via INTRAVENOUS

## 2018-03-11 MED ORDER — IOHEXOL 300 MG/ML  SOLN
INTRAMUSCULAR | Status: DC | PRN
  Administered 2018-03-11: 50 mL via URETHRAL

## 2018-03-11 MED ORDER — DEXAMETHASONE SODIUM PHOSPHATE 10 MG/ML IJ SOLN
INTRAMUSCULAR | Status: DC | PRN
  Administered 2018-03-11: 10 mg via INTRAVENOUS

## 2018-03-11 MED ORDER — FENTANYL CITRATE (PF) 100 MCG/2ML IJ SOLN
INTRAMUSCULAR | Status: AC
  Filled 2018-03-11: qty 2

## 2018-03-11 MED ORDER — SODIUM CHLORIDE 0.9 % IV SOLN
1.0000 g | Freq: Every day | INTRAVENOUS | Status: DC
  Administered 2018-03-11 – 2018-03-14 (×4): 1 g via INTRAVENOUS
  Filled 2018-03-11: qty 10
  Filled 2018-03-11 (×4): qty 1

## 2018-03-11 MED ORDER — ONDANSETRON HCL 4 MG/2ML IJ SOLN
INTRAMUSCULAR | Status: DC | PRN
  Administered 2018-03-11: 4 mg via INTRAVENOUS

## 2018-03-11 MED ORDER — LIDOCAINE 2% (20 MG/ML) 5 ML SYRINGE
INTRAMUSCULAR | Status: AC
  Filled 2018-03-11: qty 5

## 2018-03-11 MED ORDER — MUPIROCIN 2 % EX OINT
1.0000 "application " | TOPICAL_OINTMENT | Freq: Two times a day (BID) | CUTANEOUS | Status: DC
  Administered 2018-03-11: 1 via NASAL
  Filled 2018-03-11: qty 22

## 2018-03-11 MED ORDER — FENTANYL CITRATE (PF) 100 MCG/2ML IJ SOLN: 25.0000 ug | INTRAMUSCULAR | Status: DC | PRN

## 2018-03-11 MED ORDER — FENTANYL CITRATE (PF) 100 MCG/2ML IJ SOLN
INTRAMUSCULAR | Status: DC | PRN
  Administered 2018-03-11: 25 ug via INTRAVENOUS

## 2018-03-11 MED ORDER — CEFAZOLIN SODIUM-DEXTROSE 2-3 GM-%(50ML) IV SOLR
INTRAVENOUS | Status: DC | PRN
  Administered 2018-03-11: 2 g via INTRAVENOUS

## 2018-03-11 SURGICAL SUPPLY — 16 items
BAG URINE DRAINAGE (UROLOGICAL SUPPLIES) ×2 IMPLANT
BAG URO CATCHER STRL LF (MISCELLANEOUS) ×3 IMPLANT
CATH FOLEY 2WAY SLVR  5CC 16FR (CATHETERS) ×2
CATH FOLEY 2WAY SLVR 5CC 16FR (CATHETERS) IMPLANT
CATH INTERMIT  6FR 70CM (CATHETERS) ×3 IMPLANT
CLOTH BEACON ORANGE TIMEOUT ST (SAFETY) ×3 IMPLANT
COVER FOOTSWITCH UNIV (MISCELLANEOUS) IMPLANT
COVER SURGICAL LIGHT HANDLE (MISCELLANEOUS) ×3 IMPLANT
GLOVE BIO SURGEON STRL SZ7.5 (GLOVE) ×3 IMPLANT
GOWN STRL REUS W/TWL LRG LVL3 (GOWN DISPOSABLE) ×6 IMPLANT
GUIDEWIRE ANG ZIPWIRE 038X150 (WIRE) ×2 IMPLANT
GUIDEWIRE STR DUAL SENSOR (WIRE) ×3 IMPLANT
MANIFOLD NEPTUNE II (INSTRUMENTS) ×3 IMPLANT
PACK CYSTO (CUSTOM PROCEDURE TRAY) ×3 IMPLANT
TUBING CONNECTING 10 (TUBING) ×2 IMPLANT
TUBING CONNECTING 10' (TUBING) ×1

## 2018-03-11 NOTE — Progress Notes (Signed)
ANTICOAGULATION CONSULT NOTE   Pharmacy Consult for Heparin Indication: pulmonary embolus  Allergies  Allergen Reactions  . Codeine Other (See Comments)    Unknown reaction    Patient Measurements: Height: 5\' 4"  (162.6 cm) Weight: 210 lb (95.3 kg) IBW/kg (Calculated) : 54.7 Heparin Dosing Weight: 76.4 kg  Vital Signs: Temp: 97.9 F (36.6 C) (07/21 0554) Temp Source: Oral (07/21 0554) BP: 143/70 (07/21 0554) Pulse Rate: 70 (07/21 0554)  Labs: Recent Labs    03/08/18 1016 03/09/18 0424 03/09/18 1454 03/09/18 2259 03/10/18 0352 03/11/18 0603  HGB 10.6* 9.3*  --   --  9.1* 9.3*  HCT 33.4* 29.7*  --   --  29.6* 29.1*  PLT 368 332  --   --  343 PLATELET CLUMPS NOTED ON SMEAR, COUNT APPEARS ADEQUATE  APTT  --   --  45*  --   --   --   LABPROT  --   --  14.9  --   --   --   INR  --   --  1.17  --   --   --   HEPARINUNFRC  --   --   --  0.46 0.51 0.50  CREATININE 1.45* 1.28*  --   --   --  1.71*    Estimated Creatinine Clearance: 29.4 mL/min (A) (by C-G formula based on SCr of 1.71 mg/dL (H)).   Medical History: Past Medical History:  Diagnosis Date  . Fibromyalgia   . Hypertension   . Lupus (HCC)   . MVP (mitral valve prolapse)     Medications:  Infusions:  . sodium chloride 50 mL/hr at 03/10/18 1330  . heparin 1,200 Units/hr (03/11/18 0812)  .  sodium bicarbonate (isotonic) infusion in sterile water 50 mL/hr at 03/09/18 1541    Assessment: 82 yoF admitted on 7/18 with abdominal pain, dark colored urine.  PMH of SLE, HTN, mitral valve prolapse, anemia, and fibromyalgia. She was found to have nephrolithiasis (urology surgery deferred at this time) and CT abdomen shows partially imaged RLL PE.  Doppers negative for DVT.  Pharmacy is consulted on 7/19 for Heparin IV for PE.  03/11/2018  Heparin level 0.50 therapeutic on heparin 1200 units/hr  Hgb 9.3, low but stable, Plts clumped but count appears adequate  No bleeding or other issues documented  For  cystoscopy with R stent placement today - verified with Urologist that heparin does not need to be held prior to  Goal of Therapy:  Heparin level 0.3-0.7 units/ml Monitor platelets by anticoagulation protocol: Yes   Plan:   Continue heparin IV infusion at 1200 units/hr  Daily heparin level, CBC  Follow up long term anticoagulation plans   Loralee PacasErin Brenon Antosh, PharmD, BCPS Pager: [phone] 03/11/2018, 8:18 AM

## 2018-03-11 NOTE — Progress Notes (Signed)
Subjective:  Patient underwent placement of urethral stent earlier this morning tolerated the procedure well. Feels better. Denies any chest pain or shortness of breaths.  Objective:  Vital Signs in the last 24 hours: Temp:  [97.9 F (36.6 C)-99.9 F (37.7 C)] 98.2 F (36.8 C) (07/21 1023) Pulse Rate:  [56-73] 68 (07/21 1023) Resp:  [13-20] 16 (07/21 1023) BP: (90-156)/(49-81) 152/81 (07/21 1023) SpO2:  [98 %-100 %] 99 % (07/21 1023)  Intake/Output from previous day: 07/20 0701 - 07/21 0700 In: 120 [P.O.:120] Out: -  Intake/Output from this shift: Total I/O In: 762 [I.V.:712; IV Piggyback:50] Out: 200 [Urine:200]  Physical Exam: Neck: no adenopathy, no carotid bruit, no JVD and supple, symmetrical, trachea midline Lungs: clear to auscultation bilaterally Heart: regular rate and rhythm, S1, S2 normal and 2/6 systolic murmur noted Abdomen: soft, non-tender; bowel sounds normal; no masses,  no organomegaly Extremities: extremities normal, atraumatic, no cyanosis or edema  Lab Results: Recent Labs    03/10/18 0352 03/11/18 0603  WBC 7.4 7.0  HGB 9.1* 9.3*  PLT 343 PLATELET CLUMPS NOTED ON SMEAR, COUNT APPEARS ADEQUATE   Recent Labs    03/09/18 0424 03/11/18 0603  NA 136 136  K 3.9 3.7  CL 106 101  CO2 23 28  GLUCOSE 104* 93  BUN 18 20  CREATININE 1.28* 1.71*   No results for input(s): TROPONINI in the last 72 hours.  Invalid input(s): CK, MB Hepatic Function Panel No results for input(s): PROT, ALBUMIN, AST, ALT, ALKPHOS, BILITOT, BILIDIR, IBILI in the last 72 hours. No results for input(s): CHOL in the last 72 hours. No results for input(s): PROTIME in the last 72 hours.  Imaging: Imaging results have been reviewed and Dg C-arm 1-60 Min-no Report  Result Date: 03/11/2018 Fluoroscopy was utilized by the requesting physician.  No radiographic interpretation.    Cardiac Studies:  Assessment/Plan:  Right pulmonary embolism. Right kidney stone with  hydronephrosis. Status post right ureteric stent Acute kidney injury secondary to above UTI Systemic Lupus Back pain Fibromyalgia Pulmonary embolus Glaucoma Hypertension Anemia of chronic disease Obesity Plan Continue present management Check labs in a.m. Check urine cultures Dr.Kadakia  to follow from a.m.  LOS: 3 days    Rinaldo CloudHarwani, Leylanie Woodmansee 03/11/2018, 10:33 AM

## 2018-03-11 NOTE — Anesthesia Postprocedure Evaluation (Signed)
Anesthesia Post Note  Patient: Neila Gearvelyn M Pries  Procedure(s) Performed: CYSTOSCOPY WITH RIGHT STENT PLACEMENT (Right )     Patient location during evaluation: PACU Anesthesia Type: General Level of consciousness: awake and alert Pain management: pain level controlled Vital Signs Assessment: post-procedure vital signs reviewed and stable Respiratory status: spontaneous breathing, nonlabored ventilation, respiratory function stable and patient connected to nasal cannula oxygen Cardiovascular status: blood pressure returned to baseline and stable Postop Assessment: no apparent nausea or vomiting Anesthetic complications: no    Last Vitals:  Vitals:   03/11/18 1000 03/11/18 1023  BP: (!) 134/56 (!) 152/81  Pulse: (!) 56 68  Resp: 13 16  Temp: 36.6 C 36.8 C  SpO2: 98% 99%    Last Pain:  Vitals:   03/11/18 1023  TempSrc:   PainSc: 0-No pain                 Shelton SilvasKevin D Hasten Sweitzer

## 2018-03-11 NOTE — Transfer of Care (Signed)
Immediate Anesthesia Transfer of Care Note  Patient: Traci Wilson  Procedure(s) Performed: CYSTOSCOPY WITH RIGHT STENT PLACEMENT (Right )  Patient Location: PACU  Anesthesia Type:General  Level of Consciousness: awake, alert  and oriented  Airway & Oxygen Therapy: Patient Spontanous Breathing and Patient connected to face mask oxygen  Post-op Assessment: Report given to RN and Post -op Vital signs reviewed and stable  Post vital signs: Reviewed and stable  Last Vitals:  Vitals Value Taken Time  BP    Temp    Pulse 73 03/11/2018  9:32 AM  Resp 15 03/11/2018  9:32 AM  SpO2 100 % 03/11/2018  9:32 AM  Vitals shown include unvalidated device data.  Last Pain:  Vitals:   03/11/18 0554  TempSrc: Oral  PainSc:          Complications: No apparent anesthesia complications

## 2018-03-11 NOTE — Interval H&P Note (Signed)
History and Physical Interval Note:  03/11/2018 7:47 AM  Traci GearEvelyn M Sliwinski  has presented today for surgery, with the diagnosis of Right ureteral stone  The various methods of treatment have been discussed with the patient and family. After consideration of risks, benefits and other options for treatment, the patient has consented to  Procedure(s): CYSTOSCOPY WITH STENT PLACEMENT (Right) as a surgical intervention .  The patient's history has been reviewed, patient examined, no change in status, stable for surgery.  I have reviewed the patient's chart and labs.  Questions were answered to the patient's satisfaction.     Ray ChurchEugene D Bell, III

## 2018-03-11 NOTE — Anesthesia Preprocedure Evaluation (Addendum)
Anesthesia Evaluation  Patient identified by MRN, date of birth, ID band Patient awake    Reviewed: Allergy & Precautions, NPO status , Patient's Chart, lab work & pertinent test results  Airway Mallampati: II  TM Distance: >3 FB Neck ROM: Full    Dental  (+) Edentulous Upper, Dental Advisory Given   Pulmonary     + decreased breath sounds      Cardiovascular hypertension,  Rhythm:Regular Rate:Normal     Neuro/Psych  Neuromuscular disease negative psych ROS   GI/Hepatic negative GI ROS, Neg liver ROS,   Endo/Other  Hypothyroidism   Renal/GU negative Renal ROS     Musculoskeletal  (+) Fibromyalgia -  Abdominal (+) + obese,   Peds  Hematology negative hematology ROS (+)   Anesthesia Other Findings - PE  Reproductive/Obstetrics                            Lab Results  Component Value Date   WBC 7.0 03/11/2018   HGB 9.3 (L) 03/11/2018   HCT 29.1 (L) 03/11/2018   MCV 77.8 (L) 03/11/2018   PLT PENDING 03/11/2018   Lab Results  Component Value Date   CREATININE 1.71 (H) 03/11/2018   BUN 20 03/11/2018   NA 136 03/11/2018   K 3.7 03/11/2018   CL 101 03/11/2018   CO2 28 03/11/2018   Lab Results  Component Value Date   INR 1.17 03/09/2018   ECho: - Left ventricle: The cavity size was normal. There was mild   concentric hypertrophy. Systolic function was normal. The   estimated ejection fraction was in the range of 60% to 65%. Wall   motion was normal; there were no regional wall motion   abnormalities. Doppler parameters are consistent with abnormal   left ventricular relaxation (grade 1 diastolic dysfunction). - Mitral valve: Calcified annulus. There was mild regurgitation. - Left atrium: The atrium was mildly dilated. - Right ventricle: Systolic function was mildly reduced.  Anesthesia Physical Anesthesia Plan  ASA: III  Anesthesia Plan: General   Post-op Pain Management:     Induction: Intravenous  PONV Risk Score and Plan: 4 or greater and Ondansetron, Dexamethasone and Midazolam  Airway Management Planned: LMA  Additional Equipment: None  Intra-op Plan:   Post-operative Plan: Extubation in OR  Informed Consent: I have reviewed the patients History and Physical, chart, labs and discussed the procedure including the risks, benefits and alternatives for the proposed anesthesia with the patient or authorized representative who has indicated his/her understanding and acceptance.   Dental advisory given  Plan Discussed with: CRNA  Anesthesia Plan Comments:        Anesthesia Quick Evaluation

## 2018-03-11 NOTE — Op Note (Signed)
Operative Note  Preoperative diagnosis:  1.  Right ureteral calculus  Postoperative diagnosis: 1.  Right ureteral calculus 2.  Urinary tract infection  Procedure(s): 1.  Cystoscopy with right retrograde pyelogram, right ureteral stent placement 2.  Ureteral catheterization with collection of renal pelvis urine culture  Surgeon: Modena Slater, MD  Assistants: None  Anesthesia: General  Complications: None immediate  EBL: Normal  Specimens: 1.  Urine culture  Drains/Catheters: 1.  6 x 24 double-J ureteral stent 2.  Foley catheter  Intraoperative findings: 1.  Grade 2-3 cystocele 2.  Normal urethra and bladder 3.  Right retrograde pyelogram revealed a filling defect in the proximal ureter.  In fact, I was unable to get contrast to extend past the level of obstruction.  Once able to get a wire passed and ureteral catheter into the renal pelvis, retrograde pyelogram revealed hydronephrosis.  There was purulent return of renal pelvis urine which was sent for culture.  Indication: 82 year old female who was admitted with right-sided chest and flank pain.  She was found to have pulmonary emboli as well as a obstructing right ureteral calculus.  She recovered well from her pulmonary embolus but continued to have intermittent severe right-sided flank pain consistent with her ureteral calculus and therefore the decision was made to proceed with the above operation.  Description of procedure:  The patient was identified and consent was obtained.  The patient was taken to the operating room and placed in the supine position.  The patient was placed under general anesthesia.  Perioperative antibiotics were administered.  The patient was placed in dorsal lithotomy.  Patient was prepped and draped in a standard sterile fashion and a timeout was performed.  A 21 French rigid cystoscope was advanced into the urethra and into the bladder.  Complete cystoscopy was performed with no abnormal findings  in the bladder.  The right ureter was cannulated with a sensor wire and an open-ended ureteral catheter was advanced into the distal portion of the ureter.  The wire was withdrawn and a retrograde pyelogram was performed.  No contrast extended past the level of obstruction.  I therefore inserted the sensor wire back through the open-ended ureteral catheter.  The sensor wire would not pass past the level of obstruction.  I therefore tried a Glidewire but this also would not pass the level of obstruction.  I advanced the open-ended ureteral catheter up to the level of the stone and withdrew the wire.  I then shot a retrograde pyelogram to try to manipulate the stone so that a wire could pass.  I then advanced the Glidewire up the ureter and was able to get passed the stone and into the kidney.  I then advanced the open-ended ureteral catheter over the wire past the level of the stone.  I withdrew the wire and shot another retrograde pyelogram that confirmed that I was in the kidney.  I collected a specimen which showed significant purulent urine.  This was sent for culture.  I then inserted a sensor wire through the ureteral catheter and into the kidney.  I withdrew the ureteral catheter and advanced a 6 x 24 double-J ureteral stent over the wire.  I withdrew the wire and fluoroscopy confirmed proximal placement and direct visualization confirmed a good coil within the bladder.  There continued to be significant purulent reflux from the stent therefore I decided to leave a urethral catheter to maximize bladder drainage in the presence of infection.  This concluded the operation.  The  patient tolerated the procedure well and was stable postoperatively.  Plan: She will be transferred back to the cardiology service.  Recommend continuing antibiotics for at least 7 days.  We will start with broad-spectrum ceftriaxone and narrow once speciation occurs.

## 2018-03-11 NOTE — Anesthesia Procedure Notes (Signed)
Procedure Name: LMA Insertion Date/Time: 03/11/2018 8:37 AM Performed by: Thornell MuleStubblefield, Elisabetta Mishra G, CRNA Pre-anesthesia Checklist: Patient identified, Emergency Drugs available, Suction available and Patient being monitored Patient Re-evaluated:Patient Re-evaluated prior to induction Oxygen Delivery Method: Circle system utilized Preoxygenation: Pre-oxygenation with 100% oxygen Induction Type: IV induction LMA: LMA inserted LMA Size: 4.0 Number of attempts: 1 Placement Confirmation: positive ETCO2 Tube secured with: Tape Dental Injury: Teeth and Oropharynx as per pre-operative assessment

## 2018-03-12 ENCOUNTER — Encounter (HOSPITAL_COMMUNITY): Payer: Self-pay | Admitting: Urology

## 2018-03-12 LAB — BASIC METABOLIC PANEL
Anion gap: 7 (ref 5–15)
BUN: 21 mg/dL (ref 8–23)
CHLORIDE: 107 mmol/L (ref 98–111)
CO2: 26 mmol/L (ref 22–32)
CREATININE: 1.51 mg/dL — AB (ref 0.44–1.00)
Calcium: 8.9 mg/dL (ref 8.9–10.3)
GFR calc Af Amer: 36 mL/min — ABNORMAL LOW (ref >60)
GFR calc non Af Amer: 31 mL/min — ABNORMAL LOW (ref >60)
GLUCOSE: 182 mg/dL — AB (ref 70–99)
POTASSIUM: 4.1 mmol/L (ref 3.5–5.1)
SODIUM: 140 mmol/L (ref 135–145)

## 2018-03-12 LAB — CBC
HEMATOCRIT: 29.9 % — AB (ref 36.0–46.0)
HEMOGLOBIN: 9.2 g/dL — AB (ref 12.0–15.0)
MCH: 25 pg — AB (ref 26.0–34.0)
MCHC: 30.8 g/dL (ref 30.0–36.0)
MCV: 81.3 fL (ref 78.0–100.0)
Platelets: 390 10*3/uL (ref 150–400)
RBC: 3.68 MIL/uL — ABNORMAL LOW (ref 3.87–5.11)
RDW: 17.4 % — ABNORMAL HIGH (ref 11.5–15.5)
WBC: 10.2 10*3/uL (ref 4.0–10.5)

## 2018-03-12 LAB — HEPARIN LEVEL (UNFRACTIONATED): Heparin Unfractionated: 0.53 IU/mL (ref 0.30–0.70)

## 2018-03-12 MED ORDER — SODIUM BICARBONATE 650 MG PO TABS
650.0000 mg | ORAL_TABLET | Freq: Two times a day (BID) | ORAL | Status: DC
  Administered 2018-03-12 – 2018-03-15 (×7): 650 mg via ORAL
  Filled 2018-03-12 (×7): qty 1

## 2018-03-12 NOTE — Progress Notes (Addendum)
Urology Inpatient Progress Report  Pulmonary embolus (HCC) [I26.99] Right kidney stone [N20.0] Acute pulmonary embolism without acute cor pulmonale, unspecified pulmonary embolism type (HCC) [I26.99]  Procedure(s): CYSTOSCOPY WITH RETROGRADE PYELOGRAM/URETERAL STENT PLACEMENT  1 Day Post-Op   Intv/Subj: NAEON AFVSS UOP adequate via Foley Cr stable at 1.5 R flank pain subjectively much improved Intraoperative UCx pending, remains on CTX Continues on heparin gtt  Active Problems:   Abdominal pain, acute, right lower quadrant  Current Facility-Administered Medications  Medication Dose Route Frequency Provider Last Rate Last Dose  . 0.9 %  sodium chloride infusion   Intravenous Continuous Rinaldo Cloud, MD 75 mL/hr at 03/12/18 0220    . albuterol (PROVENTIL) (2.5 MG/3ML) 0.083% nebulizer solution 3 mL  3 mL Inhalation Q6H PRN Ray Church III, MD      . cefTRIAXone (ROCEPHIN) 1 g in sodium chloride 0.9 % 100 mL IVPB  1 g Intravenous Daily Crista Elliot, MD   Stopped at 03/11/18 1309  . dorzolamide-timolol (COSOPT) 22.3-6.8 MG/ML ophthalmic solution 1 drop  1 drop Both Eyes BID Ray Church III, MD   1 drop at 03/11/18 2210  . heparin ADULT infusion 100 units/mL (25000 units/213mL sodium chloride 0.45%)  1,200 Units/hr Intravenous Continuous Ray Church III, MD 12 mL/hr at 03/11/18 0812 1,200 Units/hr at 03/11/18 0812  . latanoprost (XALATAN) 0.005 % ophthalmic solution 1 drop  1 drop Both Eyes QHS Ray Church III, MD   1 drop at 03/11/18 2210  . levothyroxine (SYNTHROID, LEVOTHROID) tablet 50 mcg  50 mcg Oral QAC breakfast Ray Church III, MD   50 mcg at 03/10/18 1036  . meclizine (ANTIVERT) tablet 12.5 mg  12.5 mg Oral TID Ray Church III, MD   12.5 mg at 03/11/18 2210  . mometasone-formoterol (DULERA) 100-5 MCG/ACT inhaler 2 puff  2 puff Inhalation BID Ray Church III, MD   2 puff at 03/11/18 2155  . mupirocin ointment (BACTROBAN) 2 % 1 application  1  application Nasal BID Crista Elliot, MD   1 application at 03/11/18 2210  . oxybutynin (DITROPAN-XL) 24 hr tablet 5 mg  5 mg Oral Daily Ray Church III, MD   5 mg at 03/10/18 1036  . oxyCODONE-acetaminophen (PERCOCET/ROXICET) 5-325 MG per tablet 1 tablet  1 tablet Oral Q4H PRN Crista Elliot, MD   1 tablet at 03/10/18 2114  . pantoprazole (PROTONIX) EC tablet 40 mg  40 mg Oral Daily Ray Church III, MD   40 mg at 03/10/18 1036     Objective: Vital: Vitals:   03/11/18 1023 03/11/18 1352 03/11/18 2017 03/12/18 0445  BP: (!) 152/81 (!) 152/70 (!) 148/69 (!) 161/78  Pulse: 68 73 75 60  Resp: 16 18 16 18   Temp: 98.2 F (36.8 C) 98.4 F (36.9 C) 98 F (36.7 C) 97.8 F (36.6 C)  TempSrc:  Oral Oral Oral  SpO2: 99% 94% 98% 100%  Weight:      Height:       I/Os: I/O last 3 completed shifts: In: 3516.2 [P.O.:720; I.V.:2646.2; IV Piggyback:150] Out: 2800 [Urine:2800]  Physical Exam:  General: Patient is in no apparent distress Lungs: Normal respiratory effort, chest expands symmetrically. GI: The abdomen is soft and nontender without mass. Genitourinary: Foley catheter draining thin pink urine. Ext: lower extremities symmetric  Lab Results: Recent Labs    03/10/18 0352 03/11/18 0603 03/12/18 0534  WBC 7.4 7.0 10.2  HGB 9.1* 9.3* 9.2*  HCT 29.6* 29.1* 29.9*   Recent Labs    03/11/18 0603 03/12/18 0534  NA 136 140  K 3.7 4.1  CL 101 107  CO2 28 26  GLUCOSE 93 182*  BUN 20 21  CREATININE 1.71* 1.51*  CALCIUM 8.6* 8.9   Recent Labs    03/09/18 1454  INR 1.17   No results for input(s): LABURIN in the last 72 hours. Results for orders placed or performed during the hospital encounter of 03/08/18  Surgical PCR screen     Status: None   Collection Time: 03/11/18  6:11 AM  Result Value Ref Range Status   MRSA, PCR NEGATIVE NEGATIVE Final   Staphylococcus aureus NEGATIVE NEGATIVE Final    Comment: (NOTE) The Xpert SA Assay (FDA approved for NASAL  specimens in patients 80 years of age and older), is one component of a comprehensive surveillance program. It is not intended to diagnose infection nor to guide or monitor treatment. Performed at [facility], [address] [place], [place] [address]     Studies/Results: Dg C-arm 1-60 Min-no Report  Result Date: 03/11/2018 Fluoroscopy was utilized by the requesting physician.  No radiographic interpretation.    Assessment: 82 y.o. female with PE and obstructing right UPJ stone now s/p right ureteral stent placement 7/21.  Plan:   - Continue CTX pending UCx results - Remove Foley catheter - Urology will coordinate ureteroscopy with laser lithotripsy and ureteral stent placement at a later date.

## 2018-03-12 NOTE — Progress Notes (Signed)
ANTICOAGULATION CONSULT NOTE   Pharmacy Consult for Heparin Indication: pulmonary embolus  Allergies  Allergen Reactions  . Codeine Other (See Comments)    Unknown reaction    Patient Measurements: Height: 5\' 4"  (162.6 cm) Weight: 210 lb (95.3 kg) IBW/kg (Calculated) : 54.7 Heparin Dosing Weight: 76.4 kg  Vital Signs: Temp: 97.8 F (36.6 C) (07/22 0445) Temp Source: Oral (07/22 0445) BP: 161/78 (07/22 0445) Pulse Rate: 60 (07/22 0445)  Labs: Recent Labs    03/09/18 1454  03/10/18 0352 03/11/18 0603 03/12/18 0534  HGB  --    < > 9.1* 9.3* 9.2*  HCT  --   --  29.6* 29.1* 29.9*  PLT  --   --  343 PLATELET CLUMPS NOTED ON SMEAR, COUNT APPEARS ADEQUATE 390  APTT 45*  --   --   --   --   LABPROT 14.9  --   --   --   --   INR 1.17  --   --   --   --   HEPARINUNFRC  --    < > 0.51 0.50 0.53  CREATININE  --   --   --  1.71* 1.51*   < > = values in this interval not displayed.    Estimated Creatinine Clearance: 33.3 mL/min (A) (by C-G formula based on SCr of 1.51 mg/dL (H)).   Medical History: Past Medical History:  Diagnosis Date  . Fibromyalgia   . Hypertension   . Lupus (HCC)   . MVP (mitral valve prolapse)     Medications:  Infusions:  . sodium chloride 75 mL/hr at 03/12/18 0220  . cefTRIAXone (ROCEPHIN)  IV Stopped (03/11/18 1309)  . heparin 1,200 Units/hr (03/11/18 [id number])    Assessment: 82 yoF admitted on 7/18 with abdominal pain, dark colored urine.  PMH of SLE, HTN, mitral valve prolapse, anemia, and fibromyalgia. She was found to have nephrolithiasis (urology surgery deferred at this time) and CT abdomen shows partially imaged RLL PE.  Doppers negative for DVT.  Pharmacy is consulted on 7/19 for Heparin IV for PE.  03/12/2018  Heparin level continues to be therapeutic on heparin 1200 units/hr  Hgb stable, Plts stable  No bleeding or other issues documented  S/p R stent placement 7/21   Goal of Therapy:  Heparin level 0.3-0.7 units/ml Monitor  platelets by anticoagulation protocol: Yes   Plan:   Continue heparin IV infusion at 1200 units/hr  Daily heparin level, CBC  Follow up long term anticoagulation plans - no mention of plans in notes as of yet   Hessie KnowsJustin M Jernard Reiber, PharmD, BCPS Pager [phone] 03/12/2018 8:23 AM

## 2018-03-12 NOTE — Care Management Important Message (Signed)
Important Message  Patient Details  Name: Traci Wilson MRN: [id number] Date of Birth: 11/07/1935   Medicare Important Message Given:  Yes    [name], Burna Atlas 03/12/2018, 10:48 AMImportant Message  Patient Details  Name: Traci Wilson MRN: [id number] Date of Birth: 06/07/1936   Medicare Important Message Given:  Yes    [name], Ernie Kasler 03/12/2018, 10:48 AM

## 2018-03-12 NOTE — Progress Notes (Signed)
Ref: Orpah Cobb, MD   Subjective:  Feeling better but extremely weak. Hematuria continues. Afebrile. Appreciate urology consult and treatment. On IV heparin for PE.  Objective:  Vital Signs in the last 24 hours: Temp:  [97.8 F (36.6 C)-98.4 F (36.9 C)] 97.8 F (36.6 C) (07/22 0445) Pulse Rate:  [56-75] 60 (07/22 0445) Cardiac Rhythm: Normal sinus rhythm (07/21 1000) Resp:  [13-18] 18 (07/22 0445) BP: (134-161)/(56-81) 161/78 (07/22 0445) SpO2:  [94 %-100 %] 98 % (07/22 0803)  Physical Exam: BP Readings from Last 1 Encounters:  03/12/18 (!) 161/78     Wt Readings from Last 1 Encounters:  03/08/18 95.3 kg (210 lb)    Weight change:  Body mass index is 36.05 kg/m. HEENT: [place]/AT, Eyes-Brown, Conjunctiva-Pale pink, Sclera-Non-icteric Neck: No JVD, No bruit, Trachea midline. Lungs:  Clear, Bilateral. Cardiac:  Regular rhythm, normal S1 and S2, no S3. II/VI systolic murmur. Abdomen:  Soft, right lumbar area-tender. BS present. Extremities:  No edema present. No cyanosis. No clubbing. CNS: AxOx3, Cranial nerves grossly intact, moves all 4 extremities.  Skin: Warm and dry.   Intake/Output from previous day: 07/21 0701 - 07/22 0700 In: 3396.2 [P.O.:600; I.V.:2646.2; IV Piggyback:150] Out: 2800 [Urine:2800]    Lab Results: BMET    Component Value Date/Time   NA 140 03/12/2018 0534   NA 136 03/11/2018 0603   NA 136 03/09/2018 0424   K 4.1 03/12/2018 0534   K 3.7 03/11/2018 0603   K 3.9 03/09/2018 0424   CL 107 03/12/2018 0534   CL 101 03/11/2018 0603   CL 106 03/09/2018 0424   CO2 26 03/12/2018 0534   CO2 28 03/11/2018 0603   CO2 23 03/09/2018 0424   GLUCOSE 182 (H) 03/12/2018 0534   GLUCOSE 93 03/11/2018 0603   GLUCOSE 104 (H) 03/09/2018 0424   BUN 21 03/12/2018 0534   BUN 20 03/11/2018 0603   BUN 18 03/09/2018 0424   CREATININE 1.51 (H) 03/12/2018 0534   CREATININE 1.71 (H) 03/11/2018 0603   CREATININE 1.28 (H) 03/09/2018 0424   CALCIUM 8.9 03/12/2018  0534   CALCIUM 8.6 (L) 03/11/2018 0603   CALCIUM 8.6 (L) 03/09/2018 0424   GFRNONAA 31 (L) 03/12/2018 0534   GFRNONAA 27 (L) 03/11/2018 0603   GFRNONAA 38 (L) 03/09/2018 0424   GFRAA 36 (L) 03/12/2018 0534   GFRAA 31 (L) 03/11/2018 0603   GFRAA 45 (L) 03/09/2018 0424   CBC    Component Value Date/Time   WBC 10.2 03/12/2018 0534   RBC 3.68 (L) 03/12/2018 0534   HGB 9.2 (L) 03/12/2018 0534   HCT 29.9 (L) 03/12/2018 0534   PLT 390 03/12/2018 0534   MCV 81.3 03/12/2018 0534   MCH 25.0 (L) 03/12/2018 0534   MCHC 30.8 03/12/2018 0534   RDW 17.4 (H) 03/12/2018 0534   LYMPHSABS 4.9 (H) 05/29/2012 1147   MONOABS 0.8 05/29/2012 1147   EOSABS 0.2 05/29/2012 1147   BASOSABS 0.1 05/29/2012 1147   HEPATIC Function Panel Recent Labs    03/08/18 1016  PROT 9.4*   HEMOGLOBIN A1C No components found for: HGA1C,  MPG CARDIAC ENZYMES No results found for: CKTOTAL, CKMB, CKMBINDEX, TROPONINI BNP No results for input(s): PROBNP in the last 8760 hours. TSH No results for input(s): TSH in the last 8760 hours. CHOLESTEROL No results for input(s): CHOL in the last 8760 hours.  Scheduled Meds: . dorzolamide-timolol  1 drop Both Eyes BID  . latanoprost  1 drop Both Eyes QHS  . levothyroxine  50 mcg Oral QAC breakfast  . meclizine  12.5 mg Oral TID  . mometasone-formoterol  2 puff Inhalation BID  . mupirocin ointment  1 application Nasal BID  . oxybutynin  5 mg Oral Daily  . pantoprazole  40 mg Oral Daily  . sodium bicarbonate  650 mg Oral BID   Continuous Infusions: . sodium chloride 75 mL/hr at 03/12/18 0220  . cefTRIAXone (ROCEPHIN)  IV Stopped (03/11/18 1309)  . heparin 1,200 Units/hr (03/11/18 0812)   PRN Meds:.albuterol, oxyCODONE-acetaminophen  Assessment/Plan: Right renal stone with hydronephrosis Acute kidney injury from above  Pulmonary embolism UTI Systemic lupus Back pain Fibromyalgia Glaucoma Hypertension Obesity Anemia of chronic disease  IV Xarelto or  Eliquis when hematuria is controlled PT consult for ambulation. Oral sodium bicarbonate for urinary alkalization Patient and son understood need to follow with urology for further treatment.   LOS: 4 days    Orpah CobbAjay Hamda Klutts  MD  03/12/2018, 9:02 AM

## 2018-03-12 NOTE — Evaluation (Signed)
Physical Therapy Evaluation Patient Details Name: Traci Wilson MRN: [id number] DOB: 02/06/1936 Today's Date: 03/12/2018   History of Present Illness  82 y.o. female with PE and obstructing right UPJ stone now s/p right ureteral stent placement 7/21 with PMHx significant for MVP, fibromyalgia, lupus, HTN  Clinical Impression  Pt admitted with above diagnosis. Pt currently with functional limitations due to the deficits listed below (see PT Problem List).  Pt will benefit from skilled PT to increase their independence and safety with mobility to allow discharge to the venue listed below.   Pt assisted with ambulating in hallway and declined using RW however required bil UE support for mobility today.  Pt reports family assist upon d/c.  Pt anticipates d/c home tomorrow.    Follow Up Recommendations Home health PT Recommend initial supervision upon d/c.    Equipment Recommendations  Rolling walker with 5" wheels    Recommendations for Other Services       Precautions / Restrictions Precautions Precautions: Fall Precaution Comments: legally blind      Mobility  Bed Mobility Overal bed mobility: Needs Assistance Bed Mobility: Supine to Sit     Supine to sit: Min guard;HOB elevated     General bed mobility comments: cues for technique  Transfers Overall transfer level: Needs assistance Equipment used: 1 person hand held assist Transfers: Sit to/from Stand Sit to Stand: Min guard         General transfer comment: increased time and effort, HHA for steadying, min/guard for safety  Ambulation/Gait Ambulation/Gait assistance: Min guard Gait Distance (Feet): 120 Feet Assistive device: 1 person hand held assist Gait Pattern/deviations: Step-through pattern;Decreased stride length     General Gait Details: pt pushed IV pole and had 1 HHA, declined using RW, HHA for steadying and poor vision, distance to tolerance  Stairs            Wheelchair Mobility     Modified Rankin (Stroke Patients Only)       Balance Overall balance assessment: Needs assistance         Standing balance support: Single extremity supported Standing balance-Leahy Scale: Poor Standing balance comment: requiring UE support                             Pertinent Vitals/Pain Pain Assessment: 0-10 Pain Score: 4  Pain Location: abdomen from the "stones" Pain Descriptors / Indicators: Sore Pain Intervention(s): Limited activity within patient's tolerance;Monitored during session;Repositioned    Home Living Family/patient expects to be discharged to:: Private residence Living Arrangements: Alone Available Help at Discharge: Family Type of Home: House       Home Layout: One level Home Equipment: None      Prior Function Level of Independence: Independent               Hand Dominance        Extremity/Trunk Assessment        Lower Extremity Assessment Lower Extremity Assessment: Generalized weakness       Communication   Communication: No difficulties  Cognition Arousal/Alertness: Awake/alert Behavior During Therapy: WFL for tasks assessed/performed Overall Cognitive Status: Within Functional Limits for tasks assessed                                        General Comments      Exercises  Assessment/Plan    PT Assessment Patient needs continued PT services  PT Problem List Decreased mobility;Decreased activity tolerance;Decreased strength;Decreased knowledge of use of DME;Decreased balance       PT Treatment Interventions DME instruction;Therapeutic activities;Gait training;Therapeutic exercise;Patient/family education;Functional mobility training;Balance training    PT Goals (Current goals can be found in the Care Plan section)  Acute Rehab PT Goals PT Goal Formulation: With patient Time For Goal Achievement: 03/19/18 Potential to Achieve Goals: Good    Frequency Min 3X/week   Barriers  to discharge        Co-evaluation               AM-PAC PT "6 Clicks" Daily Activity  Outcome Measure Difficulty turning over in bed (including adjusting bedclothes, sheets and blankets)?: A Little Difficulty moving from lying on back to sitting on the side of the bed? : A Lot Difficulty sitting down on and standing up from a chair with arms (e.g., wheelchair, bedside commode, etc,.)?: A Lot Help needed moving to and from a bed to chair (including a wheelchair)?: A Little Help needed walking in hospital room?: A Little Help needed climbing 3-5 steps with a railing? : A Little 6 Click Score: 16    End of Session Equipment Utilized During Treatment: Gait belt Activity Tolerance: Patient tolerated treatment well Patient left: in chair;with chair alarm set;with call bell/phone within reach   PT Visit Diagnosis: Difficulty in walking, not elsewhere classified (R26.2)    Time: [id number] PT Time Calculation (min) (ACUTE ONLY): 19 min   Charges:   PT Evaluation $PT Eval Low Complexity: 1 Low     PT G CodesZenovia Jarred:       Kati Janeese Mcgloin, PT, DPT 03/12/2018 Pager: [phone]  Maida SaleLEMYRE,KATHrine E 03/12/2018, 12:14 PM

## 2018-03-12 NOTE — Progress Notes (Signed)
Per [employer] rep pt is [facility]. CM will continue to follow along and assist as needed. Sandford Crazeora Tajah Schreiner RN,BSN,NCM [phone]

## 2018-03-13 LAB — CBC
HCT: 29.8 % — ABNORMAL LOW (ref 36.0–46.0)
Hemoglobin: 9.3 g/dL — ABNORMAL LOW (ref 12.0–15.0)
MCH: 25.6 pg — ABNORMAL LOW (ref 26.0–34.0)
MCHC: 31.2 g/dL (ref 30.0–36.0)
MCV: 82.1 fL (ref 78.0–100.0)
PLATELETS: 358 10*3/uL (ref 150–400)
RBC: 3.63 MIL/uL — ABNORMAL LOW (ref 3.87–5.11)
RDW: 17.5 % — AB (ref 11.5–15.5)
WBC: 12.4 10*3/uL — AB (ref 4.0–10.5)

## 2018-03-13 LAB — BASIC METABOLIC PANEL
ANION GAP: 8 (ref 5–15)
BUN: 22 mg/dL (ref 8–23)
CALCIUM: 8.7 mg/dL — AB (ref 8.9–10.3)
CO2: 25 mmol/L (ref 22–32)
Chloride: 109 mmol/L (ref 98–111)
Creatinine, Ser: 1.32 mg/dL — ABNORMAL HIGH (ref 0.44–1.00)
GFR calc Af Amer: 43 mL/min — ABNORMAL LOW (ref >60)
GFR, EST NON AFRICAN AMERICAN: 37 mL/min — AB (ref >60)
GLUCOSE: 85 mg/dL (ref 70–99)
Potassium: 3.9 mmol/L (ref 3.5–5.1)
Sodium: 142 mmol/L (ref 135–145)

## 2018-03-13 LAB — HEPARIN LEVEL (UNFRACTIONATED): HEPARIN UNFRACTIONATED: 0.71 [IU]/mL — AB (ref 0.30–0.70)

## 2018-03-13 MED ORDER — HEPARIN (PORCINE) IN NACL 100-0.45 UNIT/ML-% IJ SOLN
1000.0000 [IU]/h | INTRAMUSCULAR | Status: DC
  Filled 2018-03-13: qty 250

## 2018-03-13 MED ORDER — RIVAROXABAN 20 MG PO TABS: 20.0000 mg | ORAL_TABLET | Freq: Every day | ORAL | Status: DC

## 2018-03-13 MED ORDER — BISACODYL 10 MG RE SUPP
10.0000 mg | Freq: Once | RECTAL | Status: AC
Start: 2018-03-13 — End: 2018-03-13
  Administered 2018-03-13: 10 mg via RECTAL
  Filled 2018-03-13: qty 1

## 2018-03-13 MED ORDER — RIVAROXABAN 15 MG PO TABS
15.0000 mg | ORAL_TABLET | Freq: Two times a day (BID) | ORAL | Status: DC
  Administered 2018-03-13 – 2018-03-15 (×4): 15 mg via ORAL
  Filled 2018-03-13 (×5): qty 1

## 2018-03-13 NOTE — Progress Notes (Signed)
ANTICOAGULATION CONSULT NOTE   Pharmacy Consult for Heparin Indication: pulmonary embolus  Allergies  Allergen Reactions  . Codeine Other (See Comments)    Unknown reaction    Patient Measurements: Height: 5\' 4"  (162.6 cm) Weight: 210 lb (95.3 kg) IBW/kg (Calculated) : 54.7 Heparin Dosing Weight: 76.4 kg  Vital Signs: Temp: 97.8 F (36.6 C) (07/23 0652) Temp Source: Oral (07/23 0652) BP: 164/68 (07/23 0652) Pulse Rate: 50 (07/23 0652)  Labs: Recent Labs    03/11/18 0603 03/12/18 0534 03/13/18 0649  HGB 9.3* 9.2* 9.3*  HCT 29.1* 29.9* 29.8*  PLT PLATELET CLUMPS NOTED ON SMEAR, COUNT APPEARS ADEQUATE 390 358  HEPARINUNFRC 0.50 0.53 0.71*  CREATININE 1.71* 1.51* 1.32*    Estimated Creatinine Clearance: 38 mL/min (A) (by C-G formula based on SCr of 1.32 mg/dL (H)).   Medical History: Past Medical History:  Diagnosis Date  . Fibromyalgia   . Hypertension   . Lupus (HCC)   . MVP (mitral valve prolapse)     Medications:  Infusions:  . sodium chloride 75 mL/hr at 03/13/18 0534  . cefTRIAXone (ROCEPHIN)  IV Stopped (03/12/18 1006)  . heparin      Assessment: [age] yoF admitted on 7/18 with abdominal pain, dark colored urine.  PMH of SLE, HTN, mitral valve prolapse, anemia, and fibromyalgia. She was found to have nephrolithiasis (urology surgery deferred at this time) and CT abdomen shows partially imaged RLL PE.  Doppers negative for DVT.  Pharmacy is consulted on 7/19 for Heparin IV for PE.  03/13/2018  Heparin level slightly supratherapeutic on current heparin rate of 1200 units/hr  Hgb stable, Plts stable  Noted pink tinged urine per RN  S/p R stent placement 7/21   Goal of Therapy:  Heparin level 0.3-0.7 units/ml Monitor platelets by anticoagulation protocol: Yes   Plan:   Reduce IV heparin rate from 1200 units/hr to 1000 units/hr  Recheck heparin level 8 hours after heparin rate decrease  Daily heparin level, CBC  Follow up long term  anticoagulation plans - no mention of plans in notes as of yet   Hessie KnowsJustin M Josue Falconi, PharmD, BCPS Pager [phone] 03/13/2018 9:18 AM

## 2018-03-13 NOTE — Progress Notes (Signed)
Ref: Orpah Cobb, MD   Subjective:  Feeling better. Afebrile. Right lumbar pain continues. Urine still pink per nurse and patient. Creatinine is slowly improving. Ambulation improving. Blood work on admission showed increased globulin level and low albumin level.  Objective:  Vital Signs in the last 24 hours: Temp:  [97.8 F (36.6 C)-98.5 F (36.9 C)] 98.1 F (36.7 C) (07/23 1400) Pulse Rate:  [50-53] 50 (07/23 1400) Resp:  [16] 16 (07/23 1400) BP: (164-170)/(63-69) 170/63 (07/23 1400) SpO2:  [99 %-100 %] 100 % (07/23 1400)  Physical Exam: BP Readings from Last 1 Encounters:  03/13/18 (!) 170/63     Wt Readings from Last 1 Encounters:  03/08/18 95.3 kg (210 lb)    Weight change:  Body mass index is 36.05 kg/m. HEENT: [place]/AT, Eyes-Brown, PERL, EOMI, Conjunctiva-Pale pink, Sclera-Non-icteric Neck: No JVD, No bruit, Trachea midline. Lungs:  Clear, Bilateral. Cardiac:  Regular rhythm, normal S1 and S2, no S3. II/VI systolic murmur. Abdomen:  Soft, right lumbar area-tender. BS present. Extremities:  No edema present. No cyanosis. No clubbing. CNS: AxOx3, Cranial nerves grossly intact, moves all 4 extremities.  Skin: Warm and dry.   Intake/Output from previous day: 07/22 0701 - 07/23 0700 In: 480 [P.O.:480] Out: 200 [Urine:200]    Lab Results: BMET    Component Value Date/Time   NA 142 03/13/2018 0649   NA 140 03/12/2018 0534   NA 136 03/11/2018 0603   K 3.9 03/13/2018 0649   K 4.1 03/12/2018 0534   K 3.7 03/11/2018 0603   CL 109 03/13/2018 0649   CL 107 03/12/2018 0534   CL 101 03/11/2018 0603   CO2 25 03/13/2018 0649   CO2 26 03/12/2018 0534   CO2 28 03/11/2018 0603   GLUCOSE 85 03/13/2018 0649   GLUCOSE 182 (H) 03/12/2018 0534   GLUCOSE 93 03/11/2018 0603   BUN 22 03/13/2018 0649   BUN 21 03/12/2018 0534   BUN 20 03/11/2018 0603   CREATININE 1.32 (H) 03/13/2018 0649   CREATININE 1.51 (H) 03/12/2018 0534   CREATININE 1.71 (H) 03/11/2018 0603   CALCIUM  8.7 (L) 03/13/2018 0649   CALCIUM 8.9 03/12/2018 0534   CALCIUM 8.6 (L) 03/11/2018 0603   GFRNONAA 37 (L) 03/13/2018 0649   GFRNONAA 31 (L) 03/12/2018 0534   GFRNONAA 27 (L) 03/11/2018 0603   GFRAA 43 (L) 03/13/2018 0649   GFRAA 36 (L) 03/12/2018 0534   GFRAA 31 (L) 03/11/2018 0603   CBC    Component Value Date/Time   WBC 12.4 (H) 03/13/2018 0649   RBC 3.63 (L) 03/13/2018 0649   HGB 9.3 (L) 03/13/2018 0649   HCT 29.8 (L) 03/13/2018 0649   PLT 358 03/13/2018 0649   MCV 82.1 03/13/2018 0649   MCH 25.6 (L) 03/13/2018 0649   MCHC 31.2 03/13/2018 0649   RDW 17.5 (H) 03/13/2018 0649   LYMPHSABS 4.9 (H) 05/29/2012 1147   MONOABS 0.8 05/29/2012 1147   EOSABS 0.2 05/29/2012 1147   BASOSABS 0.1 05/29/2012 1147   HEPATIC Function Panel Recent Labs    03/08/18 1016  PROT 9.4*   HEMOGLOBIN A1C No components found for: HGA1C,  MPG CARDIAC ENZYMES No results found for: CKTOTAL, CKMB, CKMBINDEX, TROPONINI BNP No results for input(s): PROBNP in the last 8760 hours. TSH No results for input(s): TSH in the last 8760 hours. CHOLESTEROL No results for input(s): CHOL in the last 8760 hours.  Scheduled Meds: . dorzolamide-timolol  1 drop Both Eyes BID  . latanoprost  1 drop Both  Eyes QHS  . levothyroxine  50 mcg Oral QAC breakfast  . meclizine  12.5 mg Oral TID  . mometasone-formoterol  2 puff Inhalation BID  . oxybutynin  5 mg Oral Daily  . pantoprazole  40 mg Oral Daily  . sodium bicarbonate  650 mg Oral BID   Continuous Infusions: . sodium chloride 75 mL/hr at 03/13/18 0534  . cefTRIAXone (ROCEPHIN)  IV Stopped (03/13/18 1024)  . heparin 1,000 Units/hr (03/13/18 0922)   PRN Meds:.albuterol, oxyCODONE-acetaminophen  Assessment/Plan: Right renal stone with hydronephrosis Acute renal injury from above, improving Pulmonary embolism UTi Systemic Lupus Back pain Fibromyalgia Glaucoma Hypertension Obesity Hypoalbuminemia Hyperglobulinemia Anemia of chronic disease  IV  Xarelto per pharmacy, off heparin. Continue ambulation. Serum protein electrophoresis.   LOS: 5 days    Orpah CobbAjay Tremain Rucinski  MD  03/13/2018, 5:32 PM

## 2018-03-13 NOTE — Discharge Instructions (Signed)
Information on my medicine - XARELTO® (rivaroxaban) ° °WHY WAS XARELTO® PRESCRIBED FOR YOU? °Xarelto® was prescribed to treat blood clots that may have been found in the veins of your legs (deep vein thrombosis) or in your lungs (pulmonary embolism) and to reduce the risk of them occurring again. ° °What do you need to know about Xarelto®? °The starting dose is one 15 mg tablet taken TWICE daily with food for the FIRST 21 DAYS then on DAY 22, the dose is changed to one 20 mg tablet taken ONCE A DAY with your evening meal. ° °DO NOT stop taking Xarelto® without talking to the health care provider who prescribed the medication.  Refill your prescription for 20 mg tablets before you run out. ° °After discharge, you should have regular check-up appointments with your healthcare provider that is prescribing your Xarelto®.  In the future your dose may need to be changed if your kidney function changes by a significant amount. ° °What do you do if you miss a dose? °If you are taking Xarelto® TWICE DAILY and you miss a dose, take it as soon as you remember. You may take two 15 mg tablets (total 30 mg) at the same time then resume your regularly scheduled 15 mg twice daily the next day. ° °If you are taking Xarelto® ONCE DAILY and you miss a dose, take it as soon as you remember on the same day then continue your regularly scheduled once daily regimen the next day. Do not take two doses of Xarelto® at the same time.  ° °Important Safety Information °Xarelto® is a blood thinner medicine that can cause bleeding. You should call your healthcare provider right away if you experience any of the following: °? Bleeding from an injury or your nose that does not stop. °? Unusual colored urine (red or dark brown) or unusual colored stools (red or black). °? Unusual bruising for unknown reasons. °? A serious fall or if you hit your head (even if there is no bleeding). ° °Some medicines may interact with Xarelto® and might increase your  risk of bleeding while on Xarelto®. To help avoid this, consult your healthcare provider or pharmacist prior to using any new prescription or non-prescription medications, including herbals, vitamins, non-steroidal anti-inflammatory drugs (NSAIDs) and supplements. ° °This website has more information on Xarelto®: www.xarelto.com. ° °

## 2018-03-13 NOTE — Progress Notes (Signed)
ANTICOAGULATION CONSULT NOTE   Pharmacy Consult for IV Heparin --> Xarelto Indication: pulmonary embolus  Allergies  Allergen Reactions  . Codeine Other (See Comments)    Unknown reaction    Patient Measurements: Height: 5\' 4"  (162.6 cm) Weight: 210 lb (95.3 kg) IBW/kg (Calculated) : 54.7 Heparin Dosing Weight: 76.4 kg  Vital Signs: Temp: 98.1 F (36.7 C) (07/23 1400) Temp Source: Oral (07/23 1400) BP: 170/63 (07/23 1400) Pulse Rate: 50 (07/23 1400)  Labs: Recent Labs    03/11/18 0603 03/12/18 0534 03/13/18 0649  HGB 9.3* 9.2* 9.3*  HCT 29.1* 29.9* 29.8*  PLT PLATELET CLUMPS NOTED ON SMEAR, COUNT APPEARS ADEQUATE 390 358  HEPARINUNFRC 0.50 0.53 0.71*  CREATININE 1.71* 1.51* 1.32*    Estimated Creatinine Clearance: 38 mL/min (A) (by C-G formula based on SCr of 1.32 mg/dL (H)).   Medical History: Past Medical History:  Diagnosis Date  . Fibromyalgia   . Hypertension   . Lupus (HCC)   . MVP (mitral valve prolapse)     Assessment: [age] yoF admitted on 7/18 with abdominal pain, dark colored urine. PMH of SLE, HTN, mitral valve prolapse, anemia, and fibromyalgia. She was found to have nephrolithiasis (urology surgery deferred at this time) and CT abdomen shows partially imaged RLL PE. Doppers negative for DVT. Pharmacy consulted on 7/19 for IV heparin for PE.   Today, 03/13/18:   Asked to transition patient to Xarelto  Hgb low, but stable. Pltc WNL  S/p R ureteral stent placement 7/21   RN noted pink tinged urine, but light hematuria expected with ureteral stent in place per Urology. Merlyn AlbertFred, RN, also reported this to Dr. Algie CofferKadakia.   SCr improved to 1.32, CrCl > 30 ml/min   Plan:   D/C IV heparin now.  Start Xarelto 15mg  PO BID with meals through 04/02/18, then 20mg  PO daily with supper starting 04/03/18.  Monitor renal function, CBC, and for s/sx of bleeding.  Pharmacy to provide education prior to discharge.    Greer PickerelJigna Mihran Lebarron, PharmD, BCPS Pager:  [phone] 03/13/2018 5:56 PM

## 2018-03-13 NOTE — Progress Notes (Signed)
Urology Inpatient Progress Report  Pulmonary embolus (HCC) [I26.99] Right kidney stone [N20.0] Acute pulmonary embolism without acute cor pulmonale, unspecified pulmonary embolism type (HCC) [I26.99]  Procedure(s): CYSTOSCOPY WITH RETROGRADE PYELOGRAM/URETERAL STENT PLACEMENT  2 Days Post-Op   Intv/Subj: NAEON Asymptomatic bradycardia, but AF with other VSS UOP adequate via voiding Cr improved to 1.32 R flank pain subjectively much improved Intraoperative UCx growing >100K CFU GNR, remains on CTX Continues on heparin gtt  Active Problems:   Abdominal pain, acute, right lower quadrant  Current Facility-Administered Medications  Medication Dose Route Frequency Provider Last Rate Last Dose  . 0.9 %  sodium chloride infusion   Intravenous Continuous Rinaldo Cloud, MD 75 mL/hr at 03/13/18 0534    . albuterol (PROVENTIL) (2.5 MG/3ML) 0.083% nebulizer solution 3 mL  3 mL Inhalation Q6H PRN Ray Church III, MD   3 mL at 03/12/18 1652  . cefTRIAXone (ROCEPHIN) 1 g in sodium chloride 0.9 % 100 mL IVPB  1 g Intravenous Daily Ray Church III, MD 200 mL/hr at 03/13/18 0922 1 g at 03/13/18 0922  . dorzolamide-timolol (COSOPT) 22.3-6.8 MG/ML ophthalmic solution 1 drop  1 drop Both Eyes BID Ray Church III, MD   1 drop at 03/13/18 0924  . heparin ADULT infusion 100 units/mL (25000 units/257mL sodium chloride 0.45%)  1,000 Units/hr Intravenous Continuous Hessie Knows, RPH 10 mL/hr at 03/13/18 0922 1,000 Units/hr at 03/13/18 1610  . latanoprost (XALATAN) 0.005 % ophthalmic solution 1 drop  1 drop Both Eyes QHS Ray Church III, MD   1 drop at 03/12/18 2129  . levothyroxine (SYNTHROID, LEVOTHROID) tablet 50 mcg  50 mcg Oral QAC breakfast Ray Church III, MD   50 mcg at 03/13/18 0827  . meclizine (ANTIVERT) tablet 12.5 mg  12.5 mg Oral TID Ray Church III, MD   12.5 mg at 03/13/18 9604  . mometasone-formoterol (DULERA) 100-5 MCG/ACT inhaler 2 puff  2 puff Inhalation BID Ray Church III, MD   2 puff at 03/13/18 [phone]  . oxybutynin (DITROPAN-XL) 24 hr tablet 5 mg  5 mg Oral Daily Ray Church III, MD   5 mg at 03/12/18 0935  . oxyCODONE-acetaminophen (PERCOCET/ROXICET) 5-325 MG per tablet 1 tablet  1 tablet Oral Q4H PRN Crista Elliot, MD   1 tablet at 03/10/18 2114  . pantoprazole (PROTONIX) EC tablet 40 mg  40 mg Oral Daily Ray Church III, MD   40 mg at 03/13/18 8119  . sodium bicarbonate tablet 650 mg  650 mg Oral BID Orpah Cobb, MD   650 mg at 03/13/18 1478     Objective: Vital: Vitals:   03/12/18 2132 03/12/18 2238 03/13/18 0652 03/13/18 0842  BP:  (!) 165/69 (!) 164/68   Pulse:  (!) 53 (!) 50   Resp:  16 16   Temp:  98.5 F (36.9 C) 97.8 F (36.6 C)   TempSrc:  Oral Oral   SpO2: 99% 100% 100% 99%  Weight:      Height:       I/Os: I/O last 3 completed shifts: In: 1080 [P.O.:1080] Out: 1500 [Urine:1500]  Physical Exam:  General: Patient is in no apparent distress Lungs: Normal respiratory effort, chest expands symmetrically. GI: The abdomen is soft and nontender without mass. Ext: lower extremities symmetric  Lab Results: Recent Labs    03/11/18 0603 03/12/18 0534 03/13/18 0649  WBC 7.0 10.2 12.4*  HGB 9.3* 9.2* 9.3*  HCT 29.1* 29.9* 29.8*  Recent Labs    03/11/18 0603 03/12/18 0534 03/13/18 0649  NA 136 140 142  K 3.7 4.1 3.9  CL 101 107 109  CO2 28 26 25   GLUCOSE 93 182* 85  BUN 20 21 22   CREATININE 1.71* 1.51* 1.32*  CALCIUM 8.6* 8.9 8.7*   No results for input(s): LABPT, INR in the last 72 hours. No results for input(s): LABURIN in the last 72 hours. Results for orders placed or performed during the hospital encounter of 03/08/18  Surgical PCR screen     Status: None   Collection Time: 03/11/18  6:11 AM  Result Value Ref Range Status   MRSA, PCR NEGATIVE NEGATIVE Final   Staphylococcus aureus NEGATIVE NEGATIVE Final    Comment: (NOTE) The Xpert SA Assay (FDA approved for NASAL specimens in patients  80 years of age and older), is one component of a comprehensive surveillance program. It is not intended to diagnose infection nor to guide or monitor treatment. Performed at [facility], [address] [place], [place] [address]   Urine Culture     Status: Abnormal (Preliminary result)   Collection Time: 03/11/18  9:41 AM  Result Value Ref Range Status   Specimen Description   Final    CYSTOSCOPY Performed at [facility], [address] [place], [place] [address]    Special Requests   Final    NONE Performed at [facility], [address] [place], [place] [address]    Culture >=100,000 COLONIES/mL GRAM NEGATIVE RODS (A)  Final   Report Status PENDING  Incomplete    Studies/Results: No results found.  Assessment: 82 y.o. female with PE and obstructing right UPJ stone now s/p right ureteral stent placement 7/21.  Plan:   - Continue CTX pending UCx results - Light hematuria expected with ureteral stent in place - Urology will coordinate ureteroscopy with laser lithotripsy and ureteral stent placement at a later date.

## 2018-03-14 LAB — COMPREHENSIVE METABOLIC PANEL
ALBUMIN: 2.1 g/dL — AB (ref 3.5–5.0)
ALT: 15 U/L (ref 0–44)
ANION GAP: 5 (ref 5–15)
AST: 25 U/L (ref 15–41)
Alkaline Phosphatase: 70 U/L (ref 38–126)
BUN: 27 mg/dL — AB (ref 8–23)
CHLORIDE: 110 mmol/L (ref 98–111)
CO2: 26 mmol/L (ref 22–32)
Calcium: 8.4 mg/dL — ABNORMAL LOW (ref 8.9–10.3)
Creatinine, Ser: 1.31 mg/dL — ABNORMAL HIGH (ref 0.44–1.00)
GFR calc Af Amer: 43 mL/min — ABNORMAL LOW (ref >60)
GFR, EST NON AFRICAN AMERICAN: 37 mL/min — AB (ref >60)
GLUCOSE: 93 mg/dL (ref 70–99)
Potassium: 4 mmol/L (ref 3.5–5.1)
Sodium: 141 mmol/L (ref 135–145)
Total Bilirubin: 0.4 mg/dL (ref 0.3–1.2)
Total Protein: 7 g/dL (ref 6.5–8.1)

## 2018-03-14 LAB — CBC
HEMATOCRIT: 28.9 % — AB (ref 36.0–46.0)
HEMOGLOBIN: 8.9 g/dL — AB (ref 12.0–15.0)
MCH: 25.1 pg — ABNORMAL LOW (ref 26.0–34.0)
MCHC: 30.8 g/dL (ref 30.0–36.0)
MCV: 81.6 fL (ref 78.0–100.0)
PLATELETS: 379 10*3/uL (ref 150–400)
RBC: 3.54 MIL/uL — ABNORMAL LOW (ref 3.87–5.11)
RDW: 17.8 % — AB (ref 11.5–15.5)
WBC: 9.8 10*3/uL (ref 4.0–10.5)

## 2018-03-14 LAB — URINE CULTURE: Culture: >=100000 — AB

## 2018-03-14 NOTE — Progress Notes (Addendum)
Ref: Orpah Cobb, MD   Subjective:  Increasing ambulation. Mild hematuria persist. Now on Xarelto.  Objective:  Vital Signs in the last 24 hours: Temp:  [97.7 F (36.5 C)-98.2 F (36.8 C)] 98.2 F (36.8 C) (07/24 1323) Pulse Rate:  [50-67] 51 (07/24 1323) Resp:  [16-18] 16 (07/24 1323) BP: (153-160)/(62-79) 153/62 (07/24 1323) SpO2:  [97 %-100 %] 97 % (07/24 1952)  Physical Exam: BP Readings from Last 1 Encounters:  03/14/18 (!) 153/62     Wt Readings from Last 1 Encounters:  03/08/18 95.3 kg (210 lb)    Weight change:  Body mass index is 36.05 kg/m. HEENT: /AT, Eyes-Brown, PERL, EOMI, Conjunctiva-Pale pink, Sclera-Non-icteric Neck: No JVD, No bruit, Trachea midline. Lungs:  Clear, Bilateral. Cardiac:  Regular rhythm, normal S1 and S2, no S3. II/VI systolic murmur. Abdomen:  Soft, right lumbar tenderness. BS present. Extremities:  No edema present. No cyanosis. No clubbing. CNS: AxOx3, Cranial nerves grossly intact, moves all 4 extremities.  Skin: Warm and dry.   Intake/Output from previous day: 07/23 0701 - 07/24 0700 In: 600 [P.O.:600] Out: -     Lab Results: BMET    Component Value Date/Time   NA 141 03/14/2018 0409   NA 142 03/13/2018 0649   NA 140 03/12/2018 0534   K 4.0 03/14/2018 0409   K 3.9 03/13/2018 0649   K 4.1 03/12/2018 0534   CL 110 03/14/2018 0409   CL 109 03/13/2018 0649   CL 107 03/12/2018 0534   CO2 26 03/14/2018 0409   CO2 25 03/13/2018 0649   CO2 26 03/12/2018 0534   GLUCOSE 93 03/14/2018 0409   GLUCOSE 85 03/13/2018 0649   GLUCOSE 182 (H) 03/12/2018 0534   BUN 27 (H) 03/14/2018 0409   BUN 22 03/13/2018 0649   BUN 21 03/12/2018 0534   CREATININE 1.31 (H) 03/14/2018 0409   CREATININE 1.32 (H) 03/13/2018 0649   CREATININE 1.51 (H) 03/12/2018 0534   CALCIUM 8.4 (L) 03/14/2018 0409   CALCIUM 8.7 (L) 03/13/2018 0649   CALCIUM 8.9 03/12/2018 0534   GFRNONAA 37 (L) 03/14/2018 0409   GFRNONAA 37 (L) 03/13/2018 0649   GFRNONAA 31  (L) 03/12/2018 0534   GFRAA 43 (L) 03/14/2018 0409   GFRAA 43 (L) 03/13/2018 0649   GFRAA 36 (L) 03/12/2018 0534   CBC    Component Value Date/Time   WBC 9.8 03/14/2018 0409   RBC 3.54 (L) 03/14/2018 0409   HGB 8.9 (L) 03/14/2018 0409   HCT 28.9 (L) 03/14/2018 0409   PLT 379 03/14/2018 0409   MCV 81.6 03/14/2018 0409   MCH 25.1 (L) 03/14/2018 0409   MCHC 30.8 03/14/2018 0409   RDW 17.8 (H) 03/14/2018 0409   LYMPHSABS 4.9 (H) 05/29/2012 1147   MONOABS 0.8 05/29/2012 1147   EOSABS 0.2 05/29/2012 1147   BASOSABS 0.1 05/29/2012 1147   HEPATIC Function Panel Recent Labs    03/08/18 1016 03/14/18 0409  PROT 9.4* 7.0   HEMOGLOBIN A1C No components found for: HGA1C,  MPG CARDIAC ENZYMES No results found for: CKTOTAL, CKMB, CKMBINDEX, TROPONINI BNP No results for input(s): PROBNP in the last 8760 hours. TSH No results for input(s): TSH in the last 8760 hours. CHOLESTEROL No results for input(s): CHOL in the last 8760 hours.  Scheduled Meds: . dorzolamide-timolol  1 drop Both Eyes BID  . latanoprost  1 drop Both Eyes QHS  . levothyroxine  50 mcg Oral QAC breakfast  . meclizine  12.5 mg Oral TID  .  mometasone-formoterol  2 puff Inhalation BID  . oxybutynin  5 mg Oral Daily  . pantoprazole  40 mg Oral Daily  . rivaroxaban  15 mg Oral BID WC   Followed by  . [START ON 04/03/2018] rivaroxaban  20 mg Oral Q supper  . sodium bicarbonate  650 mg Oral BID   Continuous Infusions: . sodium chloride 25 mL/hr at 03/14/18 0940  . cefTRIAXone (ROCEPHIN)  IV Stopped (03/14/18 1015)   PRN Meds:.albuterol, oxyCODONE-acetaminophen  Assessment/Plan: Right renal stone with hydronephrosis S/P ureteral stent placement Pulmonary embolism Acute kidney injury, improving, CKD, III Systemic Lupus UTI Back pain Fibromyalgia Glaucoma Obesity Hypertension Anemia of chronic disease Hypoalbuminemia with mild hyperglobulinemia  Home in AM if stable. Protein electrophoresis in  process.   LOS: 6 days    Orpah CobbAjay Rafeef Lau  MD  03/14/2018, 8:23 PM

## 2018-03-14 NOTE — Progress Notes (Signed)
PT Cancellation Note  Patient Details Name: Traci Wilson MRN: [id number] DOB: 12/08/1935   Cancelled Treatment:    Reason Eval/Treat Not Completed: Other (comment) ambulated with nursing. [facility] Deziyah Arvin PT [phone]   Rada HayHill, Stephie Xu Elizabeth 03/14/2018, 4:34 PM

## 2018-03-15 ENCOUNTER — Other Ambulatory Visit: Payer: Self-pay | Admitting: Cardiovascular Disease

## 2018-03-15 LAB — PROTEIN ELECTROPHORESIS, SERUM
A/G RATIO SPE: 0.5 — AB (ref 0.7–1.7)
Albumin ELP: 2.2 g/dL — ABNORMAL LOW (ref 2.9–4.4)
Alpha-1-Globulin: 0.3 g/dL (ref 0.0–0.4)
Alpha-2-Globulin: 0.7 g/dL (ref 0.4–1.0)
Beta Globulin: 0.9 g/dL (ref 0.7–1.3)
Gamma Globulin: 2.6 g/dL — ABNORMAL HIGH (ref 0.4–1.8)
Globulin, Total: 4.5 g/dL — ABNORMAL HIGH (ref 2.2–3.9)
Total Protein ELP: 6.7 g/dL (ref 6.0–8.5)

## 2018-03-15 MED ORDER — RIVAROXABAN 15 MG PO TABS: 15.0000 mg | ORAL_TABLET | Freq: Two times a day (BID) | ORAL | 0 refills | Status: DC

## 2018-03-15 MED ORDER — SODIUM BICARBONATE 650 MG PO TABS: 650.0000 mg | ORAL_TABLET | Freq: Every day | ORAL | 0 refills | Status: DC

## 2018-03-15 MED ORDER — CIPROFLOXACIN HCL 500 MG PO TABS: 500.0000 mg | ORAL_TABLET | Freq: Two times a day (BID) | ORAL | Status: DC

## 2018-03-15 MED ORDER — CEPHALEXIN 500 MG PO CAPS: 500.0000 mg | ORAL_CAPSULE | Freq: Two times a day (BID) | ORAL | 0 refills | Status: AC

## 2018-03-15 MED ORDER — OXYCODONE-ACETAMINOPHEN 5-325 MG PO TABS: 1.0000 | ORAL_TABLET | Freq: Three times a day (TID) | ORAL | 0 refills | Status: DC | PRN

## 2018-03-15 NOTE — Care Management Important Message (Signed)
Important Message  Patient Details  Name: Traci Wilson MRN: [id number] Date of Birth: 08/23/1935   Medicare Important Message Given:  Yes    [name], Kayal Mula 03/15/2018, 10:48 AMImportant Message  Patient Details  Name: Traci Wilson MRN: [id number] Date of Birth: 05/10/1936   Medicare Important Message Given:  Yes    [name], Jeneen Doutt 03/15/2018, 10:48 AM

## 2018-03-15 NOTE — Care Management Note (Signed)
Case Management Note  Patient Details  Name: Neila Gearvelyn M Casso MRN: [id number] Date of Birth: 09/09/1935  Subjective/Objective:                    Action/Plan: Pt plan to discharge home. If pt needs HH after discharge pt [facility]. There are no HH orders.    Expected Discharge Date:  03/15/18               Expected Discharge Plan:  Home/Self Care  In-House Referral:     Discharge planning Services  CM Consult  Post Acute Care Choice:    Choice offered to:     DME Arranged:    DME Agency:     HH Arranged:    HH Agency:     Status of Service:  Completed, signed off  If discussed at [employer] Length of Stay Meetings, dates discussed:    Additional CommentsGeni Bers:  Qunisha Bryk, RN 03/15/2018, 11:52 AM

## 2018-03-15 NOTE — Progress Notes (Addendum)
D/C instructions given to patient and her daughter. Both verbalized understanding.   Pt D/C"D  HOME WITH HER DAUGHTER VIA WHEELCHAIR

## 2018-03-15 NOTE — Progress Notes (Signed)
Physical Therapy Treatment Patient Details Name: Traci Wilson MRN: [id number] DOB: 03/28/1936 Today's Date: 03/15/2018    History of Present Illness 82 y.o. female with PE and obstructing right UPJ stone now s/p right ureteral stent placement 7/21 with PMHx significant for MVP, fibromyalgia, lupus, HTN    PT Comments    Patient is very pleasant but declines OOB activities, reports she thinks she is going home today but is agreeable to exercise in the bed. Performed functional exercises as seen below in note with good tolerance noted by patient, education provided regarding HHPT recommendation and general transition to HHPT from acute care PT moving forward. She was left in bed with all needs met, alarm activated this morning.     Follow Up Recommendations  Home health PT     Equipment Recommendations  Rolling walker with 5" wheels    Recommendations for Other Services       Precautions / Restrictions Precautions Precautions: Fall Precaution Comments: legally blind Restrictions Weight Bearing Restrictions: No    Mobility  Bed Mobility               General bed mobility comments: patient declined   Transfers                 General transfer comment: patient declined   Ambulation/Gait             General Gait Details: patient declined    Stairs             Wheelchair Mobility    Modified Rankin (Stroke Patients Only)       Balance Overall balance assessment: Needs assistance         Standing balance support: Single extremity supported Standing balance-Leahy Scale: Poor Standing balance comment: requiring UE support                            Cognition Arousal/Alertness: Awake/alert Behavior During Therapy: WFL for tasks assessed/performed Overall Cognitive Status: Within Functional Limits for tasks assessed                                        Exercises General Exercises - Lower  Extremity Ankle Circles/Pumps: Both;20 reps;Supine Heel Slides: Both;10 reps;Supine Hip ABduction/ADduction: Both;10 reps;Supine Straight Leg Raises: Both;10 reps;Supine Other Exercises Other Exercises: bridges 1x10; LE press with PT restistance 1x10 B     General Comments        Pertinent Vitals/Pain Pain Assessment: 0-10 Pain Score: 5  Pain Location: R flank  Pain Descriptors / Indicators: Sore;Discomfort Pain Intervention(s): Limited activity within patient's tolerance;Monitored during session    Home Living                      Prior Function            PT Goals (current goals can now be found in the care plan section) Acute Rehab PT Goals PT Goal Formulation: With patient Time For Goal Achievement: 03/19/18 Potential to Achieve Goals: Good Progress towards PT goals: Not progressing toward goals - comment(patient declined OOB mobility , unable to assess this session )    Frequency    Min 3X/week      PT Plan Current plan remains appropriate    Co-evaluation              AM-PAC PT "  6 Clicks" Daily Activity  Outcome Measure  Difficulty turning over in bed (including adjusting bedclothes, sheets and blankets)?: A Little Difficulty moving from lying on back to sitting on the side of the bed? : A Lot Difficulty sitting down on and standing up from a chair with arms (e.g., wheelchair, bedside commode, etc,.)?: A Lot Help needed moving to and from a bed to chair (including a wheelchair)?: A Little Help needed walking in hospital room?: A Little Help needed climbing 3-5 steps with a railing? : A Little 6 Click Score: 16    End of Session   Activity Tolerance: Patient tolerated treatment well Patient left: in bed;with call bell/phone within reach;with bed alarm set Nurse Communication: Mobility status PT Visit Diagnosis: Difficulty in walking, not elsewhere classified (R26.2)     Time: 1025-1039 PT Time Calculation (min) (ACUTE ONLY): 14  min  Charges:  $Therapeutic Exercise: 8-22 mins                    G Codes:       Deniece Ree PT, DPT, CBIS  Supplemental Physical Therapist [place]   Pager [phone]

## 2018-03-17 NOTE — Discharge Summary (Signed)
Physician Discharge Summary  Patient ID: Neila Gearvelyn M Liese MRN: [id number] DOB/AGE: 79/03/1936 82 y.o.  Admit date: 03/08/2018 Discharge date: 03/15/2018  Admission Diagnoses: Acute abdominal pain, RUQ and RLQ Right renal stone with hydronephrosis SLE Pulmonary embolism Systemic lupus Fibromyalgia Back pain Glaucoma Obesity Hypertension Anemia of chronic disease  Discharge Diagnoses:  Principle problem: *Right renal stone with hydronephrosis* Active Problems:   S/P right ureteral stent placement   Abdominal pain, acute, right upper quadrant, resolved   Back pain, right sided, improving   SLE   Pulmonary embolism   Acute kidney injury, improving   CKD III from hypertension   UTI   Fibromyalgia   Glaucoma   Obesity   Hypertension   Anemia of chronic disease   Hypoalbuminemia   Hyper Gamma globulinemia  Discharged Condition: fair  Hospital Course: 82 year old female had 3 weeks of right upper and lower quadrant abdominal pain with dark colored urine. She had CT of abdomin and pelvis showing right renal stone with hydronephrosis and incidental finding of PE. Urology consult was obtained and ureteral stent was placed with significant improvement in right sided abdominal and back pain.  She was started on IV heparin followed by Xarelto for PE. DVT of legs were ruled out. Her globulin level was high. This will be rechecked in 1 month post control of infection and inflammation and if persistently high then she will be referred to Hematologist. Her UTI was treated with IV Rocephin followed by PO Keflex. Her ambulation improved with nursing help. Home health nurse and PT were recommended but patient and family declined. She will see me in 1 week and Urologist as arranged.  Consults: urology  Significant Diagnostic Studies: labs: Normal WBC and platelets counts were normal. Hgb was low at 10.6 g/dL. Electrolytes were normal. Blood sugasr was elevated at 115 mg and creatinine was  1.45. Total protein was 9.4 g and albumin was low at 2.8 g/dL. Serum protein electrophoresis showed mild hyper gamma globulinemia. Lipase was normal.  Urine culture was positive for E Coli, resistant to ampicillin and ciprofloxacin.  Treatments: Stent placement in Ureter, IV antibiotic Rocephin, IV fluids with IV bicarbonate use. IV heparin followed by PO Xarelto.  Discharge Exam: Blood pressure (!) 152/80, pulse 60, temperature 97.9 F (36.6 C), temperature source Oral, resp. rate 16, height 5\' 4"  (1.626 m), weight 95.3 kg (210 lb), SpO2 97 %. General appearance: alert, cooperative and appears stated age. Head: Normocephalic, atraumatic. Eyes: Brown eyes, pale pink conjunctiva, corneas clear.   Neck: No adenopathy, no carotid bruit, no JVD, supple, symmetrical, trachea midline and thyroid not enlarged. Resp: Clear to auscultation bilaterally. Cardio: Regular rate and rhythm, S1, S2 normal, II/VI systolic murmur, no click, rub or gallop. GI: Soft, non-tender; bowel sounds normal; no organomegaly. Back: Mild right lumbar tenderness. Extremities: No edema, cyanosis or clubbing. Skin: Warm and dry.  Neurologic: Alert and oriented X 3, normal strength and tone. Slow gait with walker use.  Disposition:  01, Home/Self care.   Allergies as of 03/15/2018      Reactions   Codeine Other (See Comments)   Unknown reaction      Medication List    TAKE these medications   cephALEXin 500 MG capsule Commonly known as:  KEFLEX Take 1 capsule (500 mg total) by mouth 2 (two) times daily for 10 days. Notes to patient:  Take one as soon as you pick up your script   diazepam 5 MG tablet Commonly known as:  VALIUM  Take 5 mg by mouth every 6 (six) hours as needed for anxiety. Notes to patient:  Every 6 as hours as needed   dorzolamide-timolol 22.3-6.8 MG/ML ophthalmic solution Commonly known as:  COSOPT Place 1 drop into both eyes 2 (two) times daily.   levothyroxine 50 MCG tablet Commonly  known as:  SYNTHROID Take 1 tablet (50 mcg total) by mouth daily.   LUMIGAN 0.01 % Soln Generic drug:  bimatoprost Place 1 drop into both eyes daily.   meclizine 12.5 MG tablet Commonly known as:  ANTIVERT Take 1 tablet by mouth 3 (three) times daily.   mometasone-formoterol 100-5 MCG/ACT Aero Commonly known as:  DULERA Inhale 2 puffs into the lungs 2 (two) times daily.   NEOMYCIN-BACITRACIN-POLYMYXIN OP Place 1 drop into both ears 3 (three) times daily.   omeprazole 20 MG capsule Commonly known as:  PRILOSEC Take 20 mg by mouth daily as needed (heartburn).   oxybutynin 5 MG 24 hr tablet Commonly known as:  DITROPAN-XL Take 5 mg by mouth daily.   oxyCODONE-acetaminophen 5-325 MG tablet Commonly known as:  PERCOCET/ROXICET Take 1 tablet by mouth every 8 (eight) hours as needed for moderate pain or severe pain.   PROAIR HFA 108 (90 Base) MCG/ACT inhaler Generic drug:  albuterol Inhale 2 puffs into the lungs every 6 (six) hours as needed for shortness of breath.   Rivaroxaban 15 MG Tabs tablet Commonly known as:  XARELTO Take 1 tablet (15 mg total) by mouth 2 (two) times daily with a meal.   SIMBRINZA 1-0.2 % Susp Generic drug:  Brinzolamide-Brimonidine Place 1 drop into both eyes 2 (two) times daily.   sodium bicarbonate 650 MG tablet Take 1 tablet (650 mg total) by mouth daily.      Follow-up Information    Orpah Cobb, MD. Schedule an appointment as soon as possible for a visit in 1 week(s).   Specialty:  Cardiology Contact information: [address] [place] [address] [phone]           Signed: Ricki Rodriguez 03/17/2018, 8:44 AM

## 2018-04-10 ENCOUNTER — Other Ambulatory Visit: Payer: Self-pay | Admitting: Urology

## 2018-04-12 NOTE — Patient Instructions (Addendum)
Traci Wilson  04/12/2018   Your procedure is scheduled on: 04-16-18  [facility]  Report to admitting at 1145 AM    Call this number if you have problems the morning of surgery [phone]   Remember: Do not eat food :After Midnight.CLEAR LIQUIDS FROM MIDNIGHT UNTIL 745 AM DAY OF SURGERY. NOTHING BY MOUTH AFTER 745 AM MORNING OF SURGERY.    Take these medicines the morning of surgery with A SIP OF WATER: ALBUTEROL AND DULERA INHALER IF NEEDED AND BRING INHALERS, MECLIZINE (ANTIVERT),  EYE DROPS AS USUAL, LEVOTHRYOXINE (SYNTHROID), antibiotic              You may not have any metal on your body including hair pins and              piercings  Do not wear jewelry, make-up, lotions, powders or perfumes, deodorant             Do not wear nail polish.  Do not shave  48 hours prior to surgery.            Do not bring valuables to the hospital. [facility] IS NOT             RESPONSIBLE   FOR VALUABLES.  Contacts, dentures or bridgework may not be worn into surgery.  Leave suitcase in the car. After surgery it may be brought to your room.                  Please read over the following fact sheets you were given: _____________________________________________________________________             [facility] Pre[facility] Before surgery, you can play an important role.  Because skin is not sterile, your skin needs to be as free of germs as possible.  You can reduce the number of germs on your skin by washing with CHG (chlorahexidine gluconate) soap before surgery.  CHG is an antiseptic cleaner which kills germs and bonds with the skin to continue killing germs even after washing. Please DO NOT use if you have an allergy to CHG or antibacterial soaps.  If your skin becomes reddened/irritated stop using the CHG and inform your nurse when you arrive at Short Stay. Do not shave (including legs and underarms) for at least 48 hours prior  to the first CHG shower.  You may shave your face/neck. Please follow these instructions carefully:  1.  Shower with CHG Soap the night before surgery and the  morning of Surgery.  2.  If you choose to wash your hair, wash your hair first as usual with your  normal  shampoo.  3.  After you shampoo, rinse your hair and body thoroughly to remove the  shampoo.                           4.  Use CHG as you would any other liquid soap.  You can apply chg directly  to the skin and wash                       Gently with a scrungie or clean washcloth.  5.  Apply the CHG Soap to your body ONLY FROM THE NECK DOWN.   Do not use on face/ open  Wound or open sores. Avoid contact with eyes, ears mouth and genitals (private parts).                       Wash face,  Genitals (private parts) with your normal soap.             6.  Wash thoroughly, paying special attention to the area where your surgery  will be performed.  7.  Thoroughly rinse your body with warm water from the neck down.  8.  DO NOT shower/wash with your normal soap after using and rinsing off  the CHG Soap.                9.  Pat yourself dry with a clean towel.            10.  Wear clean pajamas.            11.  Place clean sheets on your bed the night of your first shower and do not  sleep with pets. Day of Surgery : Do not apply any lotions/deodorants the morning of surgery.  Please wear clean clothes to the hospital/surgery center.  FAILURE TO FOLLOW THESE INSTRUCTIONS MAY RESULT IN THE CANCELLATION OF YOUR SURGERY PATIENT SIGNATURE_________________________________  NURSE SIGNATURE__________________________________  ________________________________________________________________________

## 2018-04-13 ENCOUNTER — Ambulatory Visit (HOSPITAL_COMMUNITY)
Admission: RE | Admit: 2018-04-13 | Discharge: 2018-04-13 | Disposition: A | Discharge status: Home / Self Care | Payer: Medicare HMO | Source: Ambulatory Visit | Attending: Anesthesiology | Admitting: Anesthesiology

## 2018-04-13 ENCOUNTER — Other Ambulatory Visit: Payer: Self-pay

## 2018-04-13 ENCOUNTER — Encounter (HOSPITAL_COMMUNITY)
Admission: RE | Admit: 2018-04-13 | Discharge: 2018-04-13 | Disposition: A | Discharge status: Home / Self Care | Payer: Medicare HMO | Source: Ambulatory Visit | Attending: Urology | Admitting: Urology

## 2018-04-13 ENCOUNTER — Encounter (HOSPITAL_COMMUNITY): Payer: Self-pay

## 2018-04-13 DIAGNOSIS — Z01810 Encounter for preprocedural cardiovascular examination: Secondary | ICD-10-CM | POA: Insufficient documentation

## 2018-04-13 DIAGNOSIS — N200 Calculus of kidney: Secondary | ICD-10-CM | POA: Diagnosis not present

## 2018-04-13 DIAGNOSIS — I498 Other specified cardiac arrhythmias: Secondary | ICD-10-CM | POA: Insufficient documentation

## 2018-04-13 DIAGNOSIS — R9431 Abnormal electrocardiogram [ECG] [EKG]: Secondary | ICD-10-CM | POA: Diagnosis not present

## 2018-04-13 DIAGNOSIS — Z01818 Encounter for other preprocedural examination: Secondary | ICD-10-CM

## 2018-04-13 HISTORY — DX: Anemia, unspecified: D64.9

## 2018-04-13 HISTORY — DX: Unspecified asthma, uncomplicated: J45.909

## 2018-04-13 HISTORY — DX: Unspecified glaucoma: H40.9

## 2018-04-13 HISTORY — DX: Personal history of urinary calculi: Z87.442

## 2018-04-13 HISTORY — DX: Pneumonia, unspecified organism: J18.9

## 2018-04-13 HISTORY — DX: Hypothyroidism, unspecified: E03.9

## 2018-04-13 HISTORY — DX: Cardiac arrhythmia, unspecified: I49.9

## 2018-04-13 LAB — CBC
HCT: 37.9 % (ref 36.0–46.0)
HEMOGLOBIN: 11.6 g/dL — AB (ref 12.0–15.0)
MCH: 25.7 pg — AB (ref 26.0–34.0)
MCHC: 30.6 g/dL (ref 30.0–36.0)
MCV: 84 fL (ref 78.0–100.0)
Platelets: 324 10*3/uL (ref 150–400)
RBC: 4.51 MIL/uL (ref 3.87–5.11)
RDW: 17.8 % — ABNORMAL HIGH (ref 11.5–15.5)
WBC: 6.4 10*3/uL (ref 4.0–10.5)

## 2018-04-13 LAB — BASIC METABOLIC PANEL
ANION GAP: 7 (ref 5–15)
BUN: 23 mg/dL (ref 8–23)
CALCIUM: 9.7 mg/dL (ref 8.9–10.3)
CO2: 27 mmol/L (ref 22–32)
Chloride: 109 mmol/L (ref 98–111)
Creatinine, Ser: 1.56 mg/dL — ABNORMAL HIGH (ref 0.44–1.00)
GFR calc non Af Amer: 30 mL/min — ABNORMAL LOW (ref >60)
GFR, EST AFRICAN AMERICAN: 35 mL/min — AB (ref >60)
Glucose, Bld: 84 mg/dL (ref 70–99)
POTASSIUM: 4.4 mmol/L (ref 3.5–5.1)
Sodium: 143 mmol/L (ref 135–145)

## 2018-04-15 MED ORDER — GENTAMICIN SULFATE 40 MG/ML IJ SOLN
360.0000 mg | INTRAVENOUS | Status: AC
  Administered 2018-04-16: 360 mg via INTRAVENOUS
  Filled 2018-04-15: qty 9

## 2018-04-15 NOTE — H&P (Signed)
CC: I have had kidney stone surgery.  HPI: Traci Wilson is a 82 year-old female established patient who is here for renal calculi after a surgical intervention.  This lady was evaluated for right flank pain and was found to have an obstructing 1cm right proximal ureteral calculi requiring ureteral stent placement by Dr. Alvester MorinBell on 7/21. Numerous concerns for infection and patient had positive urine culture for Escherichia coli resistant to penicillins and fluoroquinolones. Patient incidentally found to have pulmonary embolism but no DVT. She was started on appropriate anticoagulation therapy for such. Patient discharged from the hospital on 7/25 with Keflex. She presents today for reevaluation in anticipation of urs for definitive stone management in the near future.    Patient has been tolerating her stent well with only occasional right-sided discomfort. This is her first stone event and she denies any other additional prior urological history. She has had increased frequency/urgency but nocturia remains stable 1. Denies issues with urine stream. She has not had burning or painful urination. Denies leaking. No problems starting/stopping stream Denies gross hematuria. She has since completed her antimicrobial treatment prescribed at hospital discharge. Patient denies prior history of recurrent urinary tract infections. Past surgical history significant for partial hysterectomy and appendectomy in 1985. She also has history of chronic asthma but does not require oxygen therapy or CPAP. Dr Algie CofferKadakia is her cardiologist.   The stone was on the right side. She had Stent for treatment of her renal calculi. Patient denies Ureteroscopy, ESWL, and PCNL. This procedure was done 03/11/2018. She did not pass a stone since the last office visit. This is her first kidney stone. She does have a stent in place.   She is currently having back pain. She denies having flank pain, groin pain, nausea, vomiting, fever, and  chills.   She does not have dysuria. She does have urgency. She does have frequency. This condition would be considered of mild to moderate severity with no modifying factors or associated signs or symptoms other than as noted above.     ALLERGIES: CODEINE    MEDICATIONS: Azithromycin 250 mg tablet 1 tablet PO Daily  Brimonidine Tartrate 0.2 % drops 1 PO Daily  Diazepam 5 mg tablet 1 tablet PO Daily  Dorzolamide-Timolol 22.3 mg-6.8 mg/ml drops 1 PO Daily  Levothyroxine Sodium 150 mcg tablet 1 tablet PO Daily  Lumigan 0.01 % drops 1 PO Daily  Meclizine Hcl 12.5 mg tablet 1 tablet PO Daily  Oxybutynin Chloride Er 5 mg tablet, extended release 24 hr 1 tablet PO Daily  Oxycodone-Acetaminophen 5 mg-325 mg tablet 1 tablet PO Daily  Sodium Bicarbonate 650 mg tablet 1 tablet PO Daily  Ventolin Hfa 90 mcg hfa aerosol with adapter 1 PO Daily  Xarelto 15 mg tablet 1 tablet PO Daily     GU PSH: Cystoscopy Insert Stent, Right - 03/11/2018    NON-GU PSH: None   GU PMH: None     PMH Notes:  1898-08-22 00:00:00 - Note: Normal Routine History And Physical Senior Citizen (65-80)  Pt states that she has Lupus. Hx of Heart Dz.   NON-GU PMH: Glaucoma Hypertension Pulmonary Embolism, History    FAMILY HISTORY: 2 daughters - Daughter 5 sons - Son Alzheimer's Disease - Sister, Uncle Colon Cancer - Son Death - Father, Mother Heart Attack - Father, Mother   SOCIAL HISTORY: Marital Status: Widowed Preferred Language: English; Ethnicity: Not Hispanic Or Latino; Race: Black or African American Current Smoking Status: Patient has never smoked.   Tobacco Use  Assessment Completed: Used Tobacco in last 30 days? Does not drink caffeine. Patient's occupation is/was Retired.    REVIEW OF SYSTEMS:    GU Review Female:   Patient reports frequent urination, hard to postpone urination, and get up at night to urinate. Patient denies burning /pain with urination, leakage of urine, stream starts and  stops, trouble starting your stream, have to strain to urinate, and being pregnant.  Gastrointestinal (Upper):   Patient denies nausea, vomiting, and indigestion/ heartburn.  Gastrointestinal (Lower):   Patient reports constipation. Patient denies diarrhea.  Constitutional:   Patient denies fever, night sweats, weight loss, and fatigue.  Skin:   Patient denies skin rash/ lesion and itching.  Eyes:   Patient reports blurred vision. Patient denies double vision.  Ears/ Nose/ Throat:   Patient denies sore throat and sinus problems.  Hematologic/Lymphatic:   Patient denies swollen glands and easy bruising.  Cardiovascular:   Patient denies leg swelling and chest pains.  Respiratory:   Patient denies cough and shortness of breath.  Endocrine:   Patient denies excessive thirst.  Musculoskeletal:   Patient reports back pain. Patient denies joint pain.  Neurological:   Patient reports headaches. Patient denies dizziness.  Psychologic:   Patient denies depression and anxiety.   VITAL SIGNS:      04/05/2018 11:17 AM  Weight 210 lb / 95.25 kg  Height 65 in / 165.1 cm  BP 124/76 mmHg  Pulse 80 /min  Temperature 97.6 F / 36.4 C  BMI 34.9 kg/m   MULTI-SYSTEM PHYSICAL EXAMINATION:    Constitutional: Well-nourished. No physical deformities. Normally developed. Good grooming. Appears younger than stated age.  Neck: Neck symmetrical, not swollen. Normal tracheal position.  Respiratory: No labored breathing, no use of accessory muscles.   Cardiovascular: Normal temperature, normal extremity pulses, no swelling, no varicosities.  Skin: No paleness, no jaundice, no cyanosis. No lesion, no ulcer, no rash.  Neurologic / Psychiatric: Oriented to time, oriented to place, oriented to person. No depression, no anxiety, no agitation.  Gastrointestinal: Obese abdomen. No mass, no tenderness, no rigidity. No CVAT, flank tenderness.  Musculoskeletal: Normal gait and station of head and neck.     PAST DATA  REVIEWED:  Source Of History:  Patient  Lab Test Review:   BMP  Records Review:   Previous Doctor Records, Previous Hospital Records  Urine Test Review:   Urinalysis, Urine Culture  X-Ray Review: C.T. Abdomen/Pelvis: Reviewed Films. Reviewed Report. Discussed With Patient.     04/05/18  Urinalysis  Urine Appearance Cloudy   Urine Color Yellow   Urine Glucose Neg mg/dL  Urine Bilirubin Neg mg/dL  Urine Ketones Neg mg/dL  Urine Specific Gravity 1.025   Urine Blood 3+ ery/uL  Urine pH <=5.0   Urine Protein 1+ mg/dL  Urine Urobilinogen 0.2 mg/dL  Urine Nitrites Positive   Urine Leukocyte Esterase 3+ leu/uL  Urine WBC/hpf 20 - 40/hpf   Urine RBC/hpf 10 - 20/hpf   Urine Epithelial Cells 10 - 20/hpf   Urine Bacteria Many (>50/hpf)   Urine Mucous Not Present   Urine Yeast Few (1 - 5/hpf)   Urine Trichomonas Not Present   Urine Cystals NS (Not Seen)   Urine Casts NS (Not Seen)   Urine Sperm Not Present   Notes:                     Creatinine on 07/24: 1.31   PROCEDURES:          Urinalysis w/Scope  Dipstick Dipstick Cont'd Micro  Color: Yellow Bilirubin: Neg mg/dL WBC/hpf: 20 - 16/XWR  Appearance: Cloudy Ketones: Neg mg/dL RBC/hpf: 10 - 60/AVW  Specific Gravity: 1.025 Blood: 3+ ery/uL Bacteria: Many (>50/hpf)  pH: <=5.0 Protein: 1+ mg/dL Cystals: NS (Not Seen)  Glucose: Neg mg/dL Urobilinogen: 0.2 mg/dL Casts: NS (Not Seen)    Nitrites: Positive Trichomonas: Not Present    Leukocyte Esterase: 3+ leu/uL Mucous: Not Present      Epithelial Cells: 10 - 20/hpf      Yeast: Few (1 - 5/hpf)      Sperm: Not Present    ASSESSMENT:      ICD-10 Details  1 GU:   Renal calculus - N20.0   2   Acute Cystitis/UTI - N30.00    PLAN:           Orders Labs Urine Culture          Schedule Return Visit/Planned Activity: 1-2 Weeks - Schedule Surgery, Follow up MD          Document Letter(s):  Created for Patient: Clinical Summary         Notes:   We will reculture her urine today.  . I have reviewed the risks of ureteroscopy with the patient including bleeding, infection, ureteral injury, need for a stent or secondary procedures, thrombotic events and anesthetic complications. She will likely require cardiac clearance r/t significant cardiac hx and new anti-coagulation therapy.  I'll f/u with the patient once culture results and discussion with the treating urologist regarding plan of care moving forward.    Signed by Anne Fu on 04/05/18 at 1:34 PM (EDT

## 2018-04-16 ENCOUNTER — Observation Stay (HOSPITAL_COMMUNITY)
Admission: RE | Admit: 2018-04-16 | Discharge: 2018-04-17 | Disposition: A | Discharge status: Home / Self Care | Payer: Medicare HMO | Source: Ambulatory Visit | Attending: Urology | Admitting: Urology

## 2018-04-16 ENCOUNTER — Encounter (HOSPITAL_COMMUNITY): Payer: Self-pay | Admitting: *Deleted

## 2018-04-16 ENCOUNTER — Ambulatory Visit (HOSPITAL_COMMUNITY): Payer: Medicare HMO | Admitting: Anesthesiology

## 2018-04-16 ENCOUNTER — Other Ambulatory Visit: Payer: Self-pay

## 2018-04-16 ENCOUNTER — Encounter (HOSPITAL_COMMUNITY)
Admission: RE | Disposition: A | Discharge status: Home / Self Care | Payer: Self-pay | Source: Ambulatory Visit | Attending: Urology

## 2018-04-16 ENCOUNTER — Ambulatory Visit (HOSPITAL_COMMUNITY): Payer: Medicare HMO

## 2018-04-16 DIAGNOSIS — N2889 Other specified disorders of kidney and ureter: Secondary | ICD-10-CM | POA: Diagnosis not present

## 2018-04-16 DIAGNOSIS — I10 Essential (primary) hypertension: Secondary | ICD-10-CM | POA: Diagnosis not present

## 2018-04-16 DIAGNOSIS — Z7901 Long term (current) use of anticoagulants: Secondary | ICD-10-CM | POA: Diagnosis not present

## 2018-04-16 DIAGNOSIS — N3000 Acute cystitis without hematuria: Secondary | ICD-10-CM | POA: Insufficient documentation

## 2018-04-16 DIAGNOSIS — E039 Hypothyroidism, unspecified: Secondary | ICD-10-CM | POA: Insufficient documentation

## 2018-04-16 DIAGNOSIS — M797 Fibromyalgia: Secondary | ICD-10-CM | POA: Diagnosis not present

## 2018-04-16 DIAGNOSIS — N201 Calculus of ureter: Principal | ICD-10-CM | POA: Insufficient documentation

## 2018-04-16 DIAGNOSIS — H409 Unspecified glaucoma: Secondary | ICD-10-CM | POA: Insufficient documentation

## 2018-04-16 DIAGNOSIS — Z86711 Personal history of pulmonary embolism: Secondary | ICD-10-CM | POA: Diagnosis not present

## 2018-04-16 DIAGNOSIS — R319 Hematuria, unspecified: Secondary | ICD-10-CM | POA: Diagnosis present

## 2018-04-16 DIAGNOSIS — M329 Systemic lupus erythematosus, unspecified: Secondary | ICD-10-CM | POA: Insufficient documentation

## 2018-04-16 HISTORY — PX: CYSTOSCOPY/URETEROSCOPY/HOLMIUM LASER/STENT PLACEMENT: SHX6546

## 2018-04-16 LAB — HEMOGLOBIN AND HEMATOCRIT, BLOOD
HCT: 35 % — ABNORMAL LOW (ref 36.0–46.0)
Hemoglobin: 11 g/dL — ABNORMAL LOW (ref 12.0–15.0)

## 2018-04-16 SURGERY — CYSTOSCOPY/URETEROSCOPY/HOLMIUM LASER/STENT PLACEMENT
Anesthesia: General | Site: Urethra | Laterality: Right

## 2018-04-16 MED ORDER — ONDANSETRON HCL 4 MG/2ML IJ SOLN: 4.0000 mg | INTRAMUSCULAR | Status: DC | PRN

## 2018-04-16 MED ORDER — LIDOCAINE 2% (20 MG/ML) 5 ML SYRINGE
INTRAMUSCULAR | Status: DC | PRN
  Administered 2018-04-16: 60 mg via INTRAVENOUS

## 2018-04-16 MED ORDER — EPHEDRINE SULFATE-NACL 50-0.9 MG/10ML-% IV SOSY
PREFILLED_SYRINGE | INTRAVENOUS | Status: DC | PRN
  Administered 2018-04-16 (×2): 10 mg via INTRAVENOUS

## 2018-04-16 MED ORDER — MIDAZOLAM HCL 2 MG/2ML IJ SOLN
INTRAMUSCULAR | Status: AC
  Filled 2018-04-16: qty 2

## 2018-04-16 MED ORDER — FENTANYL CITRATE (PF) 100 MCG/2ML IJ SOLN
INTRAMUSCULAR | Status: AC
  Filled 2018-04-16: qty 2

## 2018-04-16 MED ORDER — BRIMONIDINE TARTRATE 0.2 % OP SOLN
1.0000 [drp] | Freq: Two times a day (BID) | OPHTHALMIC | Status: DC
  Administered 2018-04-16 – 2018-04-17 (×2): 1 [drp] via OPHTHALMIC
  Filled 2018-04-16: qty 5

## 2018-04-16 MED ORDER — LIDOCAINE 2% (20 MG/ML) 5 ML SYRINGE
INTRAMUSCULAR | Status: AC
  Filled 2018-04-16: qty 5

## 2018-04-16 MED ORDER — DIAZEPAM 5 MG PO TABS
5.0000 mg | ORAL_TABLET | Freq: Four times a day (QID) | ORAL | Status: DC | PRN
  Administered 2018-04-16: 5 mg via ORAL
  Filled 2018-04-16: qty 1

## 2018-04-16 MED ORDER — MECLIZINE HCL 25 MG PO TABS
12.5000 mg | ORAL_TABLET | Freq: Three times a day (TID) | ORAL | Status: DC
  Administered 2018-04-16 – 2018-04-17 (×3): 12.5 mg via ORAL
  Filled 2018-04-16 (×3): qty 1

## 2018-04-16 MED ORDER — CEFAZOLIN SODIUM-DEXTROSE 2-3 GM-%(50ML) IV SOLR
INTRAVENOUS | Status: DC | PRN
  Administered 2018-04-16: 2 g via INTRAVENOUS

## 2018-04-16 MED ORDER — ACETAMINOPHEN 325 MG PO TABS
650.0000 mg | ORAL_TABLET | ORAL | Status: DC | PRN
  Filled 2018-04-16: qty 2

## 2018-04-16 MED ORDER — LACTATED RINGERS IV SOLN
INTRAVENOUS | Status: DC
  Administered 2018-04-16 (×2): via INTRAVENOUS

## 2018-04-16 MED ORDER — LEVOTHYROXINE SODIUM 50 MCG PO TABS
150.0000 ug | ORAL_TABLET | Freq: Every day | ORAL | Status: DC
  Administered 2018-04-17: 150 ug via ORAL
  Filled 2018-04-16: qty 1

## 2018-04-16 MED ORDER — OXYCODONE HCL 5 MG PO TABS: 5.0000 mg | ORAL_TABLET | ORAL | Status: DC | PRN

## 2018-04-16 MED ORDER — DIPHENHYDRAMINE HCL 12.5 MG/5ML PO ELIX: 12.5000 mg | ORAL_SOLUTION | Freq: Four times a day (QID) | ORAL | Status: DC | PRN

## 2018-04-16 MED ORDER — ALBUTEROL SULFATE HFA 108 (90 BASE) MCG/ACT IN AERS: 2.0000 | INHALATION_SPRAY | Freq: Four times a day (QID) | RESPIRATORY_TRACT | Status: DC | PRN

## 2018-04-16 MED ORDER — SODIUM CHLORIDE 0.9 % IR SOLN: 3000.0000 mL | Status: DC

## 2018-04-16 MED ORDER — DORZOLAMIDE HCL-TIMOLOL MAL 2-0.5 % OP SOLN
1.0000 [drp] | Freq: Two times a day (BID) | OPHTHALMIC | Status: DC
  Administered 2018-04-16 – 2018-04-17 (×2): 1 [drp] via OPHTHALMIC
  Filled 2018-04-16: qty 10

## 2018-04-16 MED ORDER — RIVAROXABAN 20 MG PO TABS
20.0000 mg | ORAL_TABLET | Freq: Every day | ORAL | Status: DC
  Administered 2018-04-17: 20 mg via ORAL
  Filled 2018-04-16 (×2): qty 1

## 2018-04-16 MED ORDER — MORPHINE SULFATE (PF) 2 MG/ML IV SOLN: 2.0000 mg | INTRAVENOUS | Status: DC | PRN

## 2018-04-16 MED ORDER — ONDANSETRON HCL 4 MG/2ML IJ SOLN
INTRAMUSCULAR | Status: DC | PRN
  Administered 2018-04-16: 4 mg via INTRAVENOUS

## 2018-04-16 MED ORDER — ALBUTEROL SULFATE (2.5 MG/3ML) 0.083% IN NEBU: 2.5000 mg | INHALATION_SOLUTION | Freq: Four times a day (QID) | RESPIRATORY_TRACT | Status: DC | PRN

## 2018-04-16 MED ORDER — LATANOPROST 0.005 % OP SOLN
1.0000 [drp] | Freq: Every day | OPHTHALMIC | Status: DC
  Administered 2018-04-16: 1 [drp] via OPHTHALMIC
  Filled 2018-04-16: qty 2.5

## 2018-04-16 MED ORDER — FENTANYL CITRATE (PF) 100 MCG/2ML IJ SOLN
INTRAMUSCULAR | Status: DC | PRN
  Administered 2018-04-16 (×4): 50 ug via INTRAVENOUS

## 2018-04-16 MED ORDER — SODIUM BICARBONATE 650 MG PO TABS
650.0000 mg | ORAL_TABLET | Freq: Every day | ORAL | Status: DC
  Administered 2018-04-16 – 2018-04-17 (×2): 650 mg via ORAL
  Filled 2018-04-16 (×2): qty 1

## 2018-04-16 MED ORDER — PROPOFOL 10 MG/ML IV BOLUS
INTRAVENOUS | Status: DC | PRN
  Administered 2018-04-16: 100 mg via INTRAVENOUS

## 2018-04-16 MED ORDER — DOCUSATE SODIUM 100 MG PO CAPS
100.0000 mg | ORAL_CAPSULE | Freq: Two times a day (BID) | ORAL | Status: DC
  Administered 2018-04-16 – 2018-04-17 (×2): 100 mg via ORAL
  Filled 2018-04-16 (×2): qty 1

## 2018-04-16 MED ORDER — TRAMADOL HCL 50 MG PO TABS: 50.0000 mg | ORAL_TABLET | Freq: Four times a day (QID) | ORAL | 0 refills | Status: DC | PRN

## 2018-04-16 MED ORDER — FENTANYL CITRATE (PF) 100 MCG/2ML IJ SOLN: 25.0000 ug | INTRAMUSCULAR | Status: DC | PRN

## 2018-04-16 MED ORDER — OXYBUTYNIN CHLORIDE 5 MG PO TABS: 5.0000 mg | ORAL_TABLET | Freq: Three times a day (TID) | ORAL | Status: DC | PRN

## 2018-04-16 MED ORDER — SODIUM CHLORIDE 0.9 % IR SOLN
Status: DC | PRN
  Administered 2018-04-16: 9000 mL

## 2018-04-16 MED ORDER — SODIUM CHLORIDE 0.9 % IV SOLN
INTRAVENOUS | Status: DC
  Administered 2018-04-16: 21:00:00 via INTRAVENOUS

## 2018-04-16 MED ORDER — PROMETHAZINE HCL 25 MG/ML IJ SOLN: 6.2500 mg | INTRAMUSCULAR | Status: DC | PRN

## 2018-04-16 MED ORDER — RIVAROXABAN 15 MG PO TABS: 15.0000 mg | ORAL_TABLET | Freq: Two times a day (BID) | ORAL | Status: DC

## 2018-04-16 MED ORDER — LATANOPROST 0.005 % OP SOLN: 1.0000 [drp] | Freq: Every day | OPHTHALMIC | Status: DC

## 2018-04-16 MED ORDER — DIPHENHYDRAMINE HCL 50 MG/ML IJ SOLN: 12.5000 mg | Freq: Four times a day (QID) | INTRAMUSCULAR | Status: DC | PRN

## 2018-04-16 MED ORDER — IOHEXOL 300 MG/ML  SOLN
INTRAMUSCULAR | Status: DC | PRN
  Administered 2018-04-16: 65 mL via INTRAVENOUS

## 2018-04-16 MED ORDER — DEXAMETHASONE SODIUM PHOSPHATE 10 MG/ML IJ SOLN
INTRAMUSCULAR | Status: DC | PRN
  Administered 2018-04-16: 10 mg via INTRAVENOUS

## 2018-04-16 MED ORDER — ONDANSETRON HCL 4 MG/2ML IJ SOLN
INTRAMUSCULAR | Status: AC
  Filled 2018-04-16: qty 2

## 2018-04-16 MED ORDER — CEFAZOLIN SODIUM-DEXTROSE 2-4 GM/100ML-% IV SOLN
INTRAVENOUS | Status: AC
  Filled 2018-04-16: qty 100

## 2018-04-16 MED ORDER — PANTOPRAZOLE SODIUM 40 MG PO TBEC
40.0000 mg | DELAYED_RELEASE_TABLET | Freq: Every day | ORAL | Status: DC
  Administered 2018-04-16 – 2018-04-17 (×2): 40 mg via ORAL
  Filled 2018-04-16 (×2): qty 1

## 2018-04-16 MED ORDER — VITAMIN D 1000 UNITS PO TABS
1000.0000 [IU] | ORAL_TABLET | Freq: Every day | ORAL | Status: DC
  Administered 2018-04-16 – 2018-04-17 (×2): 1000 [IU] via ORAL
  Filled 2018-04-16 (×2): qty 1

## 2018-04-16 MED ORDER — DOXYCYCLINE HYCLATE 100 MG PO TABS
100.0000 mg | ORAL_TABLET | Freq: Two times a day (BID) | ORAL | Status: DC
  Administered 2018-04-16 – 2018-04-17 (×2): 100 mg via ORAL
  Filled 2018-04-16 (×2): qty 1

## 2018-04-16 MED ORDER — DEXAMETHASONE SODIUM PHOSPHATE 10 MG/ML IJ SOLN
INTRAMUSCULAR | Status: AC
  Filled 2018-04-16: qty 1

## 2018-04-16 MED ORDER — PROPOFOL 10 MG/ML IV BOLUS
INTRAVENOUS | Status: AC
  Filled 2018-04-16: qty 20

## 2018-04-16 SURGICAL SUPPLY — 31 items
BAG URINE DRAINAGE (UROLOGICAL SUPPLIES) ×2 IMPLANT
BAG URO CATCHER STRL LF (MISCELLANEOUS) ×3 IMPLANT
BASKET LASER NITINOL 1.9FR (BASKET) IMPLANT
BASKET ZERO TIP NITINOL 2.4FR (BASKET) IMPLANT
BSKT STON RTRVL 120 1.9FR (BASKET)
BSKT STON RTRVL ZERO TP 2.4FR (BASKET)
CATH FOLEY 3WAY 30CC 24FR (CATHETERS) ×3
CATH INTERMIT  6FR 70CM (CATHETERS) ×2 IMPLANT
CATH URET 5FR 28IN CONE TIP (BALLOONS)
CATH URET 5FR 70CM CONE TIP (BALLOONS) IMPLANT
CATH URET DUAL LUMEN 6-10FR 50 (CATHETERS) ×2 IMPLANT
CATH URTH STD 24FR FL 3W 2 (CATHETERS) IMPLANT
CLOTH BEACON ORANGE TIMEOUT ST (SAFETY) ×3 IMPLANT
EXTRACTOR STONE 1.7FRX115CM (UROLOGICAL SUPPLIES) IMPLANT
FIBER LASER FLEXIVA 365 (UROLOGICAL SUPPLIES) IMPLANT
FIBER LASER TRAC TIP (UROLOGICAL SUPPLIES) ×2 IMPLANT
FORCEPS BIOP 2.4F 115CM BACKLD (INSTRUMENTS) ×2 IMPLANT
GLOVE BIO SURGEON STRL SZ7.5 (GLOVE) ×3 IMPLANT
GOWN STRL REUS W/TWL XL LVL3 (GOWN DISPOSABLE) ×3 IMPLANT
GUIDEWIRE ANG ZIPWIRE 038X150 (WIRE) ×2 IMPLANT
GUIDEWIRE STR DUAL SENSOR (WIRE) ×3 IMPLANT
GUIDEWIRE ZIPWRE .038 STRAIGHT (WIRE) ×2 IMPLANT
INFUSOR MANOMETER BAG 3000ML (MISCELLANEOUS) IMPLANT
MANIFOLD NEPTUNE II (INSTRUMENTS) ×3 IMPLANT
PACK CYSTO (CUSTOM PROCEDURE TRAY) ×3 IMPLANT
SHEATH URETERAL 12FRX28CM (UROLOGICAL SUPPLIES) IMPLANT
SHEATH URETERAL 12FRX35CM (MISCELLANEOUS) IMPLANT
SYR 30ML LL (SYRINGE) ×2 IMPLANT
TUBING CONNECTING 10 (TUBING) ×1 IMPLANT
TUBING CONNECTING 10' (TUBING) ×1
TUBING UROLOGY SET (TUBING) ×3 IMPLANT

## 2018-04-16 NOTE — Transfer of Care (Signed)
Immediate Anesthesia Transfer of Care Note  Patient: Neila Gearvelyn M Underberg  Procedure(s) Performed: CYSTOSCOPY RIGHT RETROGRADE PYELOGRAM RIGHT URETEROSCOPY HOLMIUM LASER RIGHT URETER BIOPSY STENT PLACEMENT (Right Urethra)  Patient Location: PACU  Anesthesia Type:General  Level of Consciousness: sedated  Airway & Oxygen Therapy: Patient Spontanous Breathing and Patient connected to face mask oxygen  Post-op Assessment: Report given to RN and Post -op Vital signs reviewed and stable  Post vital signs: Reviewed and stable  Last Vitals:  Vitals Value Taken Time  BP 150/70 04/16/2018  3:04 PM  Temp    Pulse 57 04/16/2018  3:08 PM  Resp 11 04/16/2018  3:08 PM  SpO2 100 % 04/16/2018  3:08 PM  Vitals shown include unvalidated device data.  Last Pain:  Vitals:   04/16/18 1200  TempSrc:   PainSc: 0-No pain         Complications: No apparent anesthesia complications

## 2018-04-16 NOTE — Op Note (Signed)
Operative Note  Preoperative diagnosis:  1.  Right ureteral calculus  Postoperative diagnosis: 1.  Right ureteral calculus with potential ureteral tumor  Procedure(s): 1.  Cystoscopy with right retrograde pyelogram, right ureteroscopy with laser lithotripsy, ureteral mass biopsy, ureteral stent placement  Surgeon: Modena SlaterEugene Yehudis Monceaux, MD  Assistants: None  Anesthesia: General  Complications: None immediate  EBL: Minimal  Specimens: 1.  Ureteral biopsy  Drains/Catheters: 1.  24 French three-way catheter 2.  6 x 24 double-J ureteral stent  Intraoperative findings: 1.  Normal urethra and bladder 2.  Right retrograde pyelogram revealed complete obstruction at the level of the proximal ureter.  Diagnostic ureteroscopy revealed a proximal ureteral mass versus severe edema with proximal calcification.  I was able to get a wire past the obstruction but could not find a true lumen past the level of obstruction with the ureteroscope.  Indication: 82 year old female recently underwent a right ureteral stent placement for an obstructing right ureteral calculus presents for the previously mentioned operation.  Description of procedure:  The patient was identified and consent was obtained.  The patient was taken to the operating room and placed in the supine position.  The patient was placed under general anesthesia.  Perioperative antibiotics were administered.  The patient was placed in dorsal lithotomy.  Patient was prepped and draped in a standard sterile fashion and a timeout was performed.  A 21 French rigid cystoscope was advanced into the urethra and into the bladder.  Cystoscopy was performed with no abnormal findings.  The stent was withdrawn just beyond the meatus and a wire was advanced into the ureter under fluoroscopic guidance.  The wire initially would not go past the level of obstruction.  I passed a semirigid ureteroscope alongside the wire up to the level of obstruction and then was  able to navigate a wire into the kidney.  I then passed an open-ended ureteral catheter over the wire past the level of obstruction and shot a retrograde pyelogram which did not really reveal a well opacified kidney.  I replaced the wire and advanced the ureteroscope up to the level of obstruction.  Given the possibility of cancer, I decided to perform a biopsy.  I was not overly aggressive with this considering she was on blood thinners and therefore large biopsies were not obtained.  I did use the laser fiber to fragment some stone that was proximal to the abnormal tissue.  I was not able to see the true lumen therefore I decided to abort lasering.  I backloaded the wire onto a rigid cystoscope and advanced that into the bladder and placed a 6 x 24 double-J ureteral stent in a standard fashion.  Fluoroscopy confirmed proximal placement and direct visualization confirmed a good coil within the bladder.  I remove the scope and placed a 24 French three-way catheter and started continuous bladder irrigation given that she was on blood thinner and more aggressive management was required.  Plan: We will monitor over night.  Follow-up on biopsy specimens.

## 2018-04-16 NOTE — Plan of Care (Signed)
  Problem: Education: Goal: Knowledge of General Education information will improve Description: Including pain rating scale, medication(s)/side effects and non-pharmacologic comfort measures Outcome: Progressing   Problem: Clinical Measurements: Goal: Ability to maintain clinical measurements within normal limits will improve Outcome: Progressing   Problem: Coping: Goal: Level of anxiety will decrease Outcome: Progressing   Problem: Pain Managment: Goal: General experience of comfort will improve Outcome: Progressing   Problem: Safety: Goal: Ability to remain free from injury will improve Outcome: Progressing   Problem: Skin Integrity: Goal: Risk for impaired skin integrity will decrease Outcome: Progressing   

## 2018-04-16 NOTE — Anesthesia Procedure Notes (Signed)
Date/Time: 04/16/2018 2:52 PM Performed by: Minerva EndsMirarchi, Dempsey Ahonen M, CRNA Oxygen Delivery Method: Simple face mask Placement Confirmation: positive ETCO2 and breath sounds checked- equal and bilateral Dental Injury: Teeth and Oropharynx as per pre-operative assessment

## 2018-04-16 NOTE — Anesthesia Procedure Notes (Signed)
Procedure Name: LMA Insertion Date/Time: 04/16/2018 1:13 PM Performed by: Minerva EndsMirarchi, Wacey Zieger M, CRNA Pre-anesthesia Checklist: Patient identified, Emergency Drugs available, Suction available and Patient being monitored Patient Re-evaluated:Patient Re-evaluated prior to induction Oxygen Delivery Method: Circle System Utilized Preoxygenation: Pre-oxygenation with 100% oxygen Induction Type: IV induction Ventilation: Mask ventilation without difficulty LMA: LMA inserted LMA Size: 4.0 Number of attempts: 1 Placement Confirmation: positive ETCO2 Tube secured with: Tape Dental Injury: Teeth and Oropharynx as per pre-operative assessment  Comments: Smooth IV induction AM CRNA--- LMA insertion atraumatic--- no teeth upper and few on bottom --- chipped--- all teeth, mouth unchanged with LMA insertion-- bilat BS

## 2018-04-16 NOTE — Anesthesia Preprocedure Evaluation (Addendum)
Anesthesia Evaluation  Patient identified by MRN, date of birth, ID band Patient awake    Reviewed: Allergy & Precautions, NPO status , Patient's Chart, lab work & pertinent test results  Airway Mallampati: II  TM Distance: >3 FB Neck ROM: Full    Dental no notable dental hx. (+) Dental Advisory Given, Chipped, Missing, Edentulous Upper   Pulmonary asthma , PE IMPRESSION: Right lower lobe pulmonary emboli partially imaged on this CT abdomen. No evidence for right heart strain.  Bibasilar atelectasis.  10 mm obstructing right UPJ stone with moderate to severe right hydronephrosis. Right lower pole nephrolithiasis.  Aortic atherosclerosis.  Left colonic diverticulosis.  These results were called by telephone at the time of interpretation on 03/08/2018 at 12:42 pm to Dr. Donnetta HutchingBRIAN COOK , who verbally acknowledged these results.    Pulmonary exam normal breath sounds clear to auscultation       Cardiovascular hypertension, Pt. on medications Normal cardiovascular exam Rhythm:Regular Rate:Normal  EF 60-65% 02/2018   Neuro/Psych negative neurological ROS  negative psych ROS   GI/Hepatic negative GI ROS, Neg liver ROS,   Endo/Other  Hypothyroidism   Renal/GU negative Renal ROS  negative genitourinary   Musculoskeletal  (+) Fibromyalgia -  Abdominal   Peds negative pediatric ROS (+)  Hematology negative hematology ROS (+)   Anesthesia Other Findings   Reproductive/Obstetrics negative OB ROS                         Anesthesia Physical Anesthesia Plan  ASA: III  Anesthesia Plan: General   Post-op Pain Management:    Induction: Intravenous  PONV Risk Score and Plan: 3 and Ondansetron, Dexamethasone and Treatment may vary due to age or medical condition  Airway Management Planned: LMA  Additional Equipment:   Intra-op Plan:   Post-operative Plan: Extubation in OR  Informed  Consent: I have reviewed the patients History and Physical, chart, labs and discussed the procedure including the risks, benefits and alternatives for the proposed anesthesia with the patient or authorized representative who has indicated his/her understanding and acceptance.   Dental advisory given  Plan Discussed with: CRNA and Surgeon  Anesthesia Plan Comments:       Anesthesia Quick Evaluation

## 2018-04-16 NOTE — Interval H&P Note (Signed)
History and Physical Interval Note:  04/16/2018 12:53 PM  Traci Wilson  has presented today for surgery, with the diagnosis of RIGHT RENAL STONE  The various methods of treatment have been discussed with the patient and family. After consideration of risks, benefits and other options for treatment, the patient has consented to  Procedure(s): CYSTOSCOPY RIGHT RETROGRADE PYELOGRAM URETEROSCOPY HOLMIUM LASER STENT PLACEMENT (Right) as a surgical intervention .  The patient's history has been reviewed, patient examined, no change in status, stable for surgery.  I have reviewed the patient's chart and labs.  Questions were answered to the patient's satisfaction.     Ray ChurchEugene D Bell, III

## 2018-04-16 NOTE — Discharge Instructions (Signed)

## 2018-04-16 NOTE — Progress Notes (Signed)
Brief Pharmacy Note re: Anticoagulation  Pt is on Xarelto 15mg  PO BID for recent PE.  This is loading dose for initial 3 weeks of therapy.  Dose should have been decreased to 20mg  daily on 04/03/18.   Discussed with urologist on call, Dr Liliane ShiWinter, and will decrease dose to appropriate treatment dose of 20mg  daily with FOOD.    Junita PushMichelle Leroi Haque, PharmD, BCPS 04/16/2018@5 :38 PM

## 2018-04-17 ENCOUNTER — Encounter (HOSPITAL_COMMUNITY): Payer: Self-pay | Admitting: Urology

## 2018-04-17 DIAGNOSIS — N201 Calculus of ureter: Secondary | ICD-10-CM | POA: Diagnosis not present

## 2018-04-17 LAB — HEMOGLOBIN AND HEMATOCRIT, BLOOD
HCT: 35 % — ABNORMAL LOW (ref 36.0–46.0)
Hemoglobin: 10.9 g/dL — ABNORMAL LOW (ref 12.0–15.0)

## 2018-04-17 MED ORDER — RIVAROXABAN 20 MG PO TABS: 20.0000 mg | ORAL_TABLET | Freq: Every day | ORAL | 1 refills | Status: AC

## 2018-04-17 NOTE — Anesthesia Postprocedure Evaluation (Signed)
Anesthesia Post Note  Patient: Traci Wilson  Procedure(s) Performed: CYSTOSCOPY RIGHT RETROGRADE PYELOGRAM RIGHT URETEROSCOPY HOLMIUM LASER RIGHT URETER BIOPSY STENT PLACEMENT (Right Urethra)     Patient location during evaluation: PACU Anesthesia Type: General Level of consciousness: awake and alert Pain management: pain level controlled Vital Signs Assessment: post-procedure vital signs reviewed and stable Respiratory status: spontaneous breathing, nonlabored ventilation, respiratory function stable and patient connected to nasal cannula oxygen Cardiovascular status: blood pressure returned to baseline and stable Postop Assessment: no apparent nausea or vomiting Anesthetic complications: no    Last Vitals:  Vitals:   04/17/18 0200 04/17/18 0533  BP: (!) 149/69 136/62  Pulse: 69 62  Resp: 18 18  Temp: 36.6 C 36.6 C  SpO2: 100% 98%    Last Pain:  Vitals:   04/17/18 0742  TempSrc:   PainSc: 0-No pain                 Kennieth RadFitzgerald, Leodan Bolyard E

## 2018-04-17 NOTE — Discharge Summary (Signed)
Physician Discharge Summary  Patient ID: LAKECIA DESCHAMPS MRN: [id number] DOB/AGE: [birth year] 80 y.o.  Admit date: 04/16/2018 Discharge date: 04/17/2018  Admission Diagnoses:  Discharge Diagnoses:  Active Problems:   Hematuria   Discharged Condition: good  Hospital Course: The patient underwent a right diagnostic ureteroscopy with biopsy and attempted lithotripsy with ureteral stent exchange.  Patient tolerated the procedure well was stable postoperatively.  Given that she was on blood thinners and a biopsy had to be performed, she remained overnight on observation with a three-way catheter for continuous bladder irrigation.  Urine remained relatively clear and the catheter was removed the following day.  Hemoglobin was stable and she was doing well overall and therefore she was discharged home in stable condition.  Consults: None  Significant Diagnostic Studies: None  Treatments: surgery: As above  Discharge Exam: Blood pressure 136/62, pulse 62, temperature 97.8 F (36.6 C), temperature source Oral, resp. rate 18, height 5\' 5"  (1.651 m), weight 96.6 kg, SpO2 98 %. General appearance: alert no acute distress Adequate peripheral perfusion of extremities Nonlabored respiration Urine light pink  Disposition:    Allergies as of 04/17/2018      Reactions   Codeine Other (See Comments)   Unknown reaction      Medication List    TAKE these medications   bimatoprost 0.01 % Soln Commonly known as:  LUMIGAN Place 1 drop into both eyes at bedtime.   brimonidine 0.2 % ophthalmic solution Commonly known as:  ALPHAGAN 1 drop 2 (two) times daily.   diazepam 5 MG tablet Commonly known as:  VALIUM Take 5 mg by mouth every 6 (six) hours as needed for anxiety.   dorzolamide-timolol 22.3-6.8 MG/ML ophthalmic solution Commonly known as:  COSOPT Place 1 drop into both eyes 2 (two) times daily.   doxycycline 100 MG tablet Commonly known as:  VIBRA-TABS Take 100 mg by mouth 2 (two)  times daily.   latanoprost 0.005 % ophthalmic solution Commonly known as:  XALATAN Place 1 drop into both eyes at bedtime.   levothyroxine 50 MCG tablet Commonly known as:  SYNTHROID, LEVOTHROID Take 1 tablet (50 mcg total) by mouth daily. What changed:    how much to take  when to take this   meclizine 12.5 MG tablet Commonly known as:  ANTIVERT Take 1 tablet by mouth 3 (three) times daily.   mometasone-formoterol 100-5 MCG/ACT Aero Commonly known as:  DULERA Inhale 2 puffs into the lungs 2 (two) times daily. What changed:    when to take this  reasons to take this   omeprazole 20 MG capsule Commonly known as:  PRILOSEC Take 20 mg by mouth daily as needed (heartburn).   oxybutynin 5 MG 24 hr tablet Commonly known as:  DITROPAN-XL Take 5 mg by mouth daily as needed (bladder spasms).   oxyCODONE-acetaminophen 5-325 MG tablet Commonly known as:  PERCOCET/ROXICET Take 1 tablet by mouth every 8 (eight) hours as needed for moderate pain or severe pain.   PROAIR HFA 108 (90 Base) MCG/ACT inhaler Generic drug:  albuterol Inhale 2 puffs into the lungs every 6 (six) hours as needed for shortness of breath.   rivaroxaban 20 MG Tabs tablet Commonly known as:  XARELTO Take 1 tablet (20 mg total) by mouth daily with supper. What changed:    medication strength  how much to take  when to take this   sodium bicarbonate 650 MG tablet Take 1 tablet (650 mg total) by mouth daily.   traMADol 50 MG tablet  Commonly known as:  ULTRAM Take 1 tablet (50 mg total) by mouth every 6 (six) hours as needed.   Vitamin D3 1000 units Caps Take 1,000 Units by mouth daily.        Signed: Ray ChurchEugene D Bell, III 04/17/2018, 6:30 PM

## 2018-04-24 ENCOUNTER — Other Ambulatory Visit: Payer: Self-pay | Admitting: Urology

## 2018-04-24 DIAGNOSIS — N130 Hydronephrosis with ureteropelvic junction obstruction: Secondary | ICD-10-CM

## 2018-05-03 ENCOUNTER — Encounter (HOSPITAL_COMMUNITY)
Admission: RE | Admit: 2018-05-03 | Discharge: 2018-05-03 | Disposition: A | Discharge status: Home / Self Care | Payer: Medicare HMO | Source: Ambulatory Visit | Attending: Urology | Admitting: Urology

## 2018-05-03 DIAGNOSIS — N130 Hydronephrosis with ureteropelvic junction obstruction: Secondary | ICD-10-CM | POA: Insufficient documentation

## 2018-05-03 MED ORDER — TECHNETIUM TC 99M MERTIATIDE
4.8000 | Freq: Once | INTRAVENOUS | Status: AC
  Administered 2018-05-03: 4.8 via INTRAVENOUS

## 2018-05-03 MED ORDER — FUROSEMIDE 10 MG/ML IJ SOLN
48.0000 mg | Freq: Once | INTRAMUSCULAR | Status: AC
  Administered 2018-05-03: 48 mg via INTRAVENOUS

## 2018-05-03 MED ORDER — FUROSEMIDE 10 MG/ML IJ SOLN
INTRAMUSCULAR | Status: AC
  Filled 2018-05-03: qty 8

## 2018-05-17 ENCOUNTER — Other Ambulatory Visit: Payer: Self-pay | Admitting: Urology

## 2018-05-28 ENCOUNTER — Other Ambulatory Visit (HOSPITAL_COMMUNITY): Payer: Medicare HMO

## 2018-07-17 NOTE — Patient Instructions (Addendum)
Traci Wilson  07/17/2018   Your procedure is scheduled on: 07-23-18   [facility]    Report to admitting at 5:30AM    Call this number if you have problems the morning of surgery [phone]     Remember: Do not eat food or drink liquids :After Midnight. BRUSH YOUR TEETH MORNING OF SURGERY AND RINSE YOUR MOUTH OUT, NO CHEWING GUM CANDY OR MINTS.     Take these medicines the morning of surgery with A SIP OF WATER: levothyroxine, eye drops, doxycycline, amoxicillin-clavulanate                                 You may not have any metal on your body including hair pins and              piercings  Do not wear jewelry, make-up, lotions, powders or perfumes, deodorant             Do not wear nail polish.  Do not shave  48 hours prior to surgery.            Do not bring valuables to the hospital. [facility] IS NOT             RESPONSIBLE   FOR VALUABLES.   Contacts, dentures or bridgework may not be worn into surgery.  Leave suitcase in the car. After surgery it may be brought to your room.                 Please read over the following fact sheets you were given: _____________________________________________________________________             [facility] - Preparing for Surgery Before surgery, you can play an important role.  Because skin is not sterile, your skin needs to be as free of germs as possible.  You can reduce the number of germs on your skin by washing with CHG (chlorahexidine gluconate) soap before surgery.  CHG is an antiseptic cleaner which kills germs and bonds with the skin to continue killing germs even after washing. Please DO NOT use if you have an allergy to CHG or antibacterial soaps.  If your skin becomes reddened/irritated stop using the CHG and inform your nurse when you arrive at Short Stay. Do not shave (including legs and underarms) for at least 48 hours prior to the first CHG shower.  You may shave your  face/neck. Please follow these instructions carefully:  1.  Shower with CHG Soap the night before surgery and the  morning of Surgery.  2.  If you choose to wash your hair, wash your hair first as usual with your  normal  shampoo.  3.  After you shampoo, rinse your hair and body thoroughly to remove the  shampoo.                           4.  Use CHG as you would any other liquid soap.  You can apply chg directly  to the skin and wash                       Gently with a scrungie or clean washcloth.  5.  Apply the CHG Soap to your body ONLY FROM THE NECK DOWN.  Do not use on face/ open                           Wound or open sores. Avoid contact with eyes, ears mouth and genitals (private parts).                       Wash face,  Genitals (private parts) with your normal soap.             6.  Wash thoroughly, paying special attention to the area where your surgery  will be performed.  7.  Thoroughly rinse your body with warm water from the neck down.  8.  DO NOT shower/wash with your normal soap after using and rinsing off  the CHG Soap.                9.  Pat yourself dry with a clean towel.            10.  Wear clean pajamas.            11.  Place clean sheets on your bed the night of your first shower and do not  sleep with pets. Day of Surgery : Do not apply any lotions/deodorants the morning of surgery.  Please wear clean clothes to the hospital/surgery center.  FAILURE TO FOLLOW THESE INSTRUCTIONS MAY RESULT IN THE CANCELLATION OF YOUR SURGERY PATIENT SIGNATURE_________________________________  NURSE SIGNATURE__________________________________  ________________________________________________________________________  WHAT IS A BLOOD TRANSFUSION? Blood Transfusion Information  A transfusion is the replacement of blood or some of its parts. Blood is made up of multiple cells which provide different functions.  Red blood cells carry oxygen and are used for blood loss  replacement.  White blood cells fight against infection.  Platelets control bleeding.  Plasma helps clot blood.  Other blood products are available for specialized needs, such as hemophilia or other clotting disorders. BEFORE THE TRANSFUSION  Who gives blood for transfusions?   Healthy volunteers who are fully evaluated to make sure their blood is safe. This is blood bank blood. Transfusion therapy is the safest it has ever been in the practice of medicine. Before blood is taken from a donor, a complete history is taken to make sure that person has no history of diseases nor engages in risky social behavior (examples are intravenous drug use or sexual activity with multiple partners). The donor's travel history is screened to minimize risk of transmitting infections, such as malaria. The donated blood is tested for signs of infectious diseases, such as HIV and hepatitis. The blood is then tested to be sure it is compatible with you in order to minimize the chance of a transfusion reaction. If you or a relative donates blood, this is often done in anticipation of surgery and is not appropriate for emergency situations. It takes many days to process the donated blood. RISKS AND COMPLICATIONS Although transfusion therapy is very safe and saves many lives, the main dangers of transfusion include:   Getting an infectious disease.  Developing a transfusion reaction. This is an allergic reaction to something in the blood you were given. Every precaution is taken to prevent this. The decision to have a blood transfusion has been considered carefully by your caregiver before blood is given. Blood is not given unless the benefits outweigh the risks. AFTER THE TRANSFUSION  Right after receiving a blood transfusion, you will usually feel much better and more energetic. This is especially  true if your red blood cells have gotten low (anemic). The transfusion raises the level of the red blood cells which  carry oxygen, and this usually causes an energy increase.  The nurse administering the transfusion will monitor you carefully for complications. HOME CARE INSTRUCTIONS  No special instructions are needed after a transfusion. You may find your energy is better. Speak with your caregiver about any limitations on activity for underlying diseases you may have. SEEK MEDICAL CARE IF:   Your condition is not improving after your transfusion.  You develop redness or irritation at the intravenous (IV) site. SEEK IMMEDIATE MEDICAL CARE IF:  Any of the following symptoms occur over the next 12 hours:  Shaking chills.  You have a temperature by mouth above 102 F (38.9 C), not controlled by medicine.  Chest, back, or muscle pain.  People around you feel you are not acting correctly or are confused.  Shortness of breath or difficulty breathing.  Dizziness and fainting.  You get a rash or develop hives.  You have a decrease in urine output.  Your urine turns a dark color or changes to pink, red, or brown. Any of the following symptoms occur over the next 10 days:  You have a temperature by mouth above 102 F (38.9 C), not controlled by medicine.  Shortness of breath.  Weakness after normal activity.  The white part of the eye turns yellow (jaundice).  You have a decrease in the amount of urine or are urinating less often.  Your urine turns a dark color or changes to pink, red, or brown. Document Released: 08/05/2000 Document Revised: 10/31/2011 Document Reviewed: 03/24/2008 [facility] Patient Information 2014 [place], [place].  _______________________________________________________________________

## 2018-07-17 NOTE — Progress Notes (Signed)
Traci Wilson, cxr  04-13-18 epic   Echo 03-08-18 epic

## 2018-07-18 ENCOUNTER — Other Ambulatory Visit: Payer: Self-pay

## 2018-07-18 ENCOUNTER — Encounter (HOSPITAL_COMMUNITY): Payer: Self-pay

## 2018-07-18 ENCOUNTER — Encounter (HOSPITAL_COMMUNITY)
Admission: RE | Admit: 2018-07-18 | Discharge: 2018-07-18 | Disposition: A | Discharge status: Home / Self Care | Payer: Medicare HMO | Source: Ambulatory Visit | Attending: Urology | Admitting: Urology

## 2018-07-18 DIAGNOSIS — N2889 Other specified disorders of kidney and ureter: Secondary | ICD-10-CM | POA: Insufficient documentation

## 2018-07-18 DIAGNOSIS — Z01818 Encounter for other preprocedural examination: Secondary | ICD-10-CM | POA: Insufficient documentation

## 2018-07-18 HISTORY — DX: Dizziness and giddiness: R42

## 2018-07-18 HISTORY — DX: Other pulmonary embolism without acute cor pulmonale: I26.99

## 2018-07-18 LAB — BASIC METABOLIC PANEL
Anion gap: 4 — ABNORMAL LOW (ref 5–15)
BUN: 24 mg/dL — AB (ref 8–23)
CALCIUM: 9.3 mg/dL (ref 8.9–10.3)
CO2: 28 mmol/L (ref 22–32)
CREATININE: 1.6 mg/dL — AB (ref 0.44–1.00)
Chloride: 107 mmol/L (ref 98–111)
GFR calc Af Amer: 35 mL/min — ABNORMAL LOW (ref >60)
GFR, EST NON AFRICAN AMERICAN: 30 mL/min — AB (ref >60)
Glucose, Bld: 88 mg/dL (ref 70–99)
POTASSIUM: 3.8 mmol/L (ref 3.5–5.1)
Sodium: 139 mmol/L (ref 135–145)

## 2018-07-18 LAB — CBC
HCT: 42.1 % (ref 36.0–46.0)
Hemoglobin: 12.8 g/dL (ref 12.0–15.0)
MCH: 26.1 pg (ref 26.0–34.0)
MCHC: 30.4 g/dL (ref 30.0–36.0)
MCV: 85.9 fL (ref 80.0–100.0)
PLATELETS: 283 10*3/uL (ref 150–400)
RBC: 4.9 MIL/uL (ref 3.87–5.11)
RDW: 14 % (ref 11.5–15.5)
WBC: 5.8 10*3/uL (ref 4.0–10.5)
nRBC: 0 % (ref 0.0–0.2)

## 2018-07-18 LAB — PROTIME-INR
INR: 3.01
PROTHROMBIN TIME: 30.8 s — AB (ref 11.4–15.2)

## 2018-07-18 LAB — ABO/RH: ABO/RH(D): O POS

## 2018-07-18 NOTE — Progress Notes (Signed)
Bmp , pt/inr routed via epic to dr Dennard Nipeugene bell

## 2018-07-20 ENCOUNTER — Other Ambulatory Visit (HOSPITAL_COMMUNITY): Payer: Medicare HMO

## 2018-07-22 NOTE — Anesthesia Preprocedure Evaluation (Addendum)
Anesthesia Evaluation  Patient identified by MRN, date of birth, ID band  Reviewed: Allergy & Precautions, NPO status , Patient's Chart, lab work & pertinent test results  History of Anesthesia Complications Negative for: history of anesthetic complications  Airway Mallampati: II  TM Distance: >3 FB Neck ROM: Full    Dental  (+) Edentulous Upper, Missing, Dental Advisory Given   Pulmonary asthma , PE IMPRESSION: Right lower lobe pulmonary emboli partially imaged on this CT abdomen. No evidence for right heart strain.  Bibasilar atelectasis.  10 mm obstructing right UPJ stone with moderate to severe right hydronephrosis. Right lower pole nephrolithiasis.  Aortic atherosclerosis.  Left colonic diverticulosis.  These results were called by telephone at the time of interpretation on 03/08/2018 at 12:42 pm to Dr. Donnetta HutchingBRIAN COOK , who verbally acknowledged these results.    Pulmonary exam normal        Cardiovascular hypertension, Pt. on medications Normal cardiovascular exam  EF 60-65% 02/2018   Neuro/Psych negative neurological ROS  negative psych ROS   GI/Hepatic negative GI ROS, Neg liver ROS,   Endo/Other  Hypothyroidism Morbid obesity  Renal/GU Renal InsufficiencyRenal disease  negative genitourinary   Musculoskeletal  (+) Fibromyalgia -  Abdominal   Peds negative pediatric ROS (+)  Hematology negative hematology ROS (+)   Anesthesia Other Findings   Reproductive/Obstetrics negative OB ROS                            Anesthesia Physical  Anesthesia Plan  ASA: III  Anesthesia Plan: General   Post-op Pain Management:    Induction: Intravenous  PONV Risk Score and Plan: 4 or greater and Ondansetron, Dexamethasone, Scopolamine patch - Pre-op and Diphenhydramine  Airway Management Planned: Oral ETT  Additional Equipment:   Intra-op Plan:   Post-operative Plan:  Extubation in OR  Informed Consent: I have reviewed the patients History and Physical, chart, labs and discussed the procedure including the risks, benefits and alternatives for the proposed anesthesia with the patient or authorized representative who has indicated his/her understanding and acceptance.   Dental advisory given  Plan Discussed with: CRNA and Anesthesiologist  Anesthesia Plan Comments:        Anesthesia Quick Evaluation

## 2018-07-23 ENCOUNTER — Inpatient Hospital Stay (HOSPITAL_COMMUNITY): Payer: Medicare HMO | Admitting: Anesthesiology

## 2018-07-23 ENCOUNTER — Encounter (HOSPITAL_COMMUNITY)
Admission: RE | Disposition: A | Discharge status: Skilled Nursing Facility | Payer: Self-pay | Source: Ambulatory Visit | Attending: Urology

## 2018-07-23 ENCOUNTER — Inpatient Hospital Stay (HOSPITAL_COMMUNITY)
Admission: RE | Admit: 2018-07-23 | Discharge: 2018-07-30 | DRG: 660 | Disposition: A | Discharge status: Skilled Nursing Facility | Payer: Medicare HMO | Source: Ambulatory Visit | Attending: Urology | Admitting: Urology

## 2018-07-23 ENCOUNTER — Encounter (HOSPITAL_COMMUNITY): Payer: Self-pay

## 2018-07-23 ENCOUNTER — Other Ambulatory Visit: Payer: Self-pay

## 2018-07-23 DIAGNOSIS — J45909 Unspecified asthma, uncomplicated: Secondary | ICD-10-CM | POA: Diagnosis present

## 2018-07-23 DIAGNOSIS — H409 Unspecified glaucoma: Secondary | ICD-10-CM | POA: Diagnosis present

## 2018-07-23 DIAGNOSIS — N202 Calculus of kidney with calculus of ureter: Secondary | ICD-10-CM | POA: Diagnosis present

## 2018-07-23 DIAGNOSIS — M797 Fibromyalgia: Secondary | ICD-10-CM | POA: Diagnosis present

## 2018-07-23 DIAGNOSIS — N289 Disorder of kidney and ureter, unspecified: Secondary | ICD-10-CM | POA: Diagnosis present

## 2018-07-23 DIAGNOSIS — Z7901 Long term (current) use of anticoagulants: Secondary | ICD-10-CM

## 2018-07-23 DIAGNOSIS — Z79899 Other long term (current) drug therapy: Secondary | ICD-10-CM | POA: Diagnosis not present

## 2018-07-23 DIAGNOSIS — Z960 Presence of urogenital implants: Secondary | ICD-10-CM | POA: Diagnosis present

## 2018-07-23 DIAGNOSIS — N261 Atrophy of kidney (terminal): Principal | ICD-10-CM | POA: Diagnosis present

## 2018-07-23 DIAGNOSIS — Z792 Long term (current) use of antibiotics: Secondary | ICD-10-CM

## 2018-07-23 DIAGNOSIS — I10 Essential (primary) hypertension: Secondary | ICD-10-CM | POA: Diagnosis present

## 2018-07-23 DIAGNOSIS — E039 Hypothyroidism, unspecified: Secondary | ICD-10-CM | POA: Diagnosis present

## 2018-07-23 DIAGNOSIS — Z885 Allergy status to narcotic agent status: Secondary | ICD-10-CM

## 2018-07-23 HISTORY — PX: LAPAROSCOPIC NEPHRECTOMY, HAND ASSISTED: SHX1929

## 2018-07-23 LAB — CBC
HCT: 39.1 % (ref 36.0–46.0)
Hemoglobin: 11.8 g/dL — ABNORMAL LOW (ref 12.0–15.0)
MCH: 26 pg (ref 26.0–34.0)
MCHC: 30.2 g/dL (ref 30.0–36.0)
MCV: 86.1 fL (ref 80.0–100.0)
Platelets: 266 10*3/uL (ref 150–400)
RBC: 4.54 MIL/uL (ref 3.87–5.11)
RDW: 14.2 % (ref 11.5–15.5)
WBC: 12.7 10*3/uL — ABNORMAL HIGH (ref 4.0–10.5)
nRBC: 0 % (ref 0.0–0.2)

## 2018-07-23 LAB — TYPE AND SCREEN
ABO/RH(D): O POS
Antibody Screen: NEGATIVE

## 2018-07-23 LAB — PROTIME-INR
INR: 1.1
Prothrombin Time: 14.1 seconds (ref 11.4–15.2)

## 2018-07-23 LAB — BASIC METABOLIC PANEL
Anion gap: 8 (ref 5–15)
BUN: 27 mg/dL — ABNORMAL HIGH (ref 8–23)
CO2: 23 mmol/L (ref 22–32)
Calcium: 9 mg/dL (ref 8.9–10.3)
Chloride: 108 mmol/L (ref 98–111)
Creatinine, Ser: 1.31 mg/dL — ABNORMAL HIGH (ref 0.44–1.00)
GFR calc non Af Amer: 38 mL/min — ABNORMAL LOW (ref >60)
GFR, EST AFRICAN AMERICAN: 44 mL/min — AB (ref >60)
Glucose, Bld: 117 mg/dL — ABNORMAL HIGH (ref 70–99)
Potassium: 3.9 mmol/L (ref 3.5–5.1)
SODIUM: 139 mmol/L (ref 135–145)

## 2018-07-23 SURGERY — NEPHRECTOMY, HAND-ASSISTED, LAPAROSCOPIC
Anesthesia: General | Site: Abdomen | Laterality: Right

## 2018-07-23 MED ORDER — CEFAZOLIN SODIUM-DEXTROSE 2-4 GM/100ML-% IV SOLN
2.0000 g | INTRAVENOUS | Status: AC
  Administered 2018-07-23: 2 g via INTRAVENOUS
  Filled 2018-07-23: qty 100

## 2018-07-23 MED ORDER — ONDANSETRON HCL 4 MG/2ML IJ SOLN: 4.0000 mg | INTRAMUSCULAR | Status: DC | PRN

## 2018-07-23 MED ORDER — CEFAZOLIN SODIUM-DEXTROSE 1-4 GM/50ML-% IV SOLN
1.0000 g | Freq: Three times a day (TID) | INTRAVENOUS | Status: AC
  Administered 2018-07-23 (×2): 1 g via INTRAVENOUS
  Filled 2018-07-23 (×2): qty 50

## 2018-07-23 MED ORDER — PHENYLEPHRINE 40 MCG/ML (10ML) SYRINGE FOR IV PUSH (FOR BLOOD PRESSURE SUPPORT)
PREFILLED_SYRINGE | INTRAVENOUS | Status: AC
  Filled 2018-07-23: qty 10

## 2018-07-23 MED ORDER — FENTANYL CITRATE (PF) 100 MCG/2ML IJ SOLN
INTRAMUSCULAR | Status: AC
  Filled 2018-07-23: qty 4

## 2018-07-23 MED ORDER — PHENYLEPHRINE 40 MCG/ML (10ML) SYRINGE FOR IV PUSH (FOR BLOOD PRESSURE SUPPORT)
PREFILLED_SYRINGE | INTRAVENOUS | Status: DC | PRN
  Administered 2018-07-23 (×2): 40 ug via INTRAVENOUS

## 2018-07-23 MED ORDER — BUPIVACAINE LIPOSOME 1.3 % IJ SUSP
20.0000 mL | Freq: Once | INTRAMUSCULAR | Status: AC
  Administered 2018-07-23: 20 mL
  Filled 2018-07-23: qty 20

## 2018-07-23 MED ORDER — HYDRALAZINE HCL 20 MG/ML IJ SOLN: 10.0000 mg | Freq: Three times a day (TID) | INTRAMUSCULAR | Status: DC | PRN

## 2018-07-23 MED ORDER — PROPOFOL 10 MG/ML IV BOLUS
INTRAVENOUS | Status: AC
  Filled 2018-07-23: qty 20

## 2018-07-23 MED ORDER — LACTATED RINGERS IV SOLN
INTRAVENOUS | Status: DC
  Administered 2018-07-23 (×2): via INTRAVENOUS

## 2018-07-23 MED ORDER — SUGAMMADEX SODIUM 200 MG/2ML IV SOLN
INTRAVENOUS | Status: DC | PRN
  Administered 2018-07-23: 200 mg via INTRAVENOUS

## 2018-07-23 MED ORDER — TRAMADOL HCL 50 MG PO TABS: 50.0000 mg | ORAL_TABLET | Freq: Four times a day (QID) | ORAL | 0 refills | Status: AC | PRN

## 2018-07-23 MED ORDER — PROMETHAZINE HCL 25 MG/ML IJ SOLN: 6.2500 mg | INTRAMUSCULAR | Status: DC | PRN

## 2018-07-23 MED ORDER — LATANOPROST 0.005 % OP SOLN
1.0000 [drp] | Freq: Every day | OPHTHALMIC | Status: DC
  Administered 2018-07-23 – 2018-07-29 (×7): 1 [drp] via OPHTHALMIC
  Filled 2018-07-23: qty 2.5

## 2018-07-23 MED ORDER — ONDANSETRON HCL 4 MG/2ML IJ SOLN
INTRAMUSCULAR | Status: AC
  Filled 2018-07-23: qty 2

## 2018-07-23 MED ORDER — LACTATED RINGERS IV SOLN
INTRAVENOUS | Status: AC | PRN
  Administered 2018-07-23: 1000 mL

## 2018-07-23 MED ORDER — EPHEDRINE SULFATE-NACL 50-0.9 MG/10ML-% IV SOSY
PREFILLED_SYRINGE | INTRAVENOUS | Status: DC | PRN
  Administered 2018-07-23 (×2): 10 mg via INTRAVENOUS

## 2018-07-23 MED ORDER — TRAMADOL HCL 50 MG PO TABS
50.0000 mg | ORAL_TABLET | Freq: Four times a day (QID) | ORAL | Status: DC | PRN
  Administered 2018-07-24 – 2018-07-29 (×6): 50 mg via ORAL
  Filled 2018-07-23 (×8): qty 1

## 2018-07-23 MED ORDER — ALBUMIN HUMAN 5 % IV SOLN
INTRAVENOUS | Status: DC | PRN
  Administered 2018-07-23: 09:00:00 via INTRAVENOUS

## 2018-07-23 MED ORDER — EPHEDRINE 5 MG/ML INJ
INTRAVENOUS | Status: AC
  Filled 2018-07-23: qty 10

## 2018-07-23 MED ORDER — MORPHINE SULFATE (PF) 2 MG/ML IV SOLN
1.0000 mg | INTRAVENOUS | Status: DC | PRN
  Administered 2018-07-23: 2 mg via INTRAVENOUS
  Filled 2018-07-23: qty 1

## 2018-07-23 MED ORDER — BRIMONIDINE TARTRATE 0.2 % OP SOLN
1.0000 [drp] | Freq: Two times a day (BID) | OPHTHALMIC | Status: DC
  Administered 2018-07-24 – 2018-07-30 (×13): 1 [drp] via OPHTHALMIC
  Filled 2018-07-23: qty 5

## 2018-07-23 MED ORDER — TRAMADOL HCL 50 MG PO TABS
100.0000 mg | ORAL_TABLET | Freq: Four times a day (QID) | ORAL | Status: DC | PRN
  Administered 2018-07-23 – 2018-07-28 (×6): 100 mg via ORAL
  Filled 2018-07-23 (×5): qty 2

## 2018-07-23 MED ORDER — 0.9 % SODIUM CHLORIDE (POUR BTL) OPTIME
TOPICAL | Status: DC | PRN
  Administered 2018-07-23: 1000 mL

## 2018-07-23 MED ORDER — SODIUM CHLORIDE (PF) 0.9 % IJ SOLN
INTRAMUSCULAR | Status: AC
  Filled 2018-07-23: qty 20

## 2018-07-23 MED ORDER — DEXAMETHASONE SODIUM PHOSPHATE 10 MG/ML IJ SOLN
INTRAMUSCULAR | Status: DC | PRN
  Administered 2018-07-23: 10 mg via INTRAVENOUS

## 2018-07-23 MED ORDER — ROCURONIUM BROMIDE 10 MG/ML (PF) SYRINGE
PREFILLED_SYRINGE | INTRAVENOUS | Status: DC | PRN
  Administered 2018-07-23 (×2): 20 mg via INTRAVENOUS
  Administered 2018-07-23: 10 mg via INTRAVENOUS
  Administered 2018-07-23: 50 mg via INTRAVENOUS

## 2018-07-23 MED ORDER — DORZOLAMIDE HCL-TIMOLOL MAL 2-0.5 % OP SOLN
1.0000 [drp] | Freq: Two times a day (BID) | OPHTHALMIC | Status: DC
  Administered 2018-07-24 – 2018-07-30 (×13): 1 [drp] via OPHTHALMIC
  Filled 2018-07-23: qty 10

## 2018-07-23 MED ORDER — SODIUM CHLORIDE 0.9% FLUSH
INTRAVENOUS | Status: DC | PRN
  Administered 2018-07-23: 20 mL

## 2018-07-23 MED ORDER — DIPHENHYDRAMINE HCL 50 MG/ML IJ SOLN
INTRAMUSCULAR | Status: AC
  Filled 2018-07-23: qty 1

## 2018-07-23 MED ORDER — LIDOCAINE 2% (20 MG/ML) 5 ML SYRINGE
INTRAMUSCULAR | Status: DC | PRN
  Administered 2018-07-23: 50 mg via INTRAVENOUS

## 2018-07-23 MED ORDER — ONDANSETRON HCL 4 MG/2ML IJ SOLN
INTRAMUSCULAR | Status: DC | PRN
  Administered 2018-07-23: 4 mg via INTRAVENOUS

## 2018-07-23 MED ORDER — MECLIZINE HCL 25 MG PO TABS
12.5000 mg | ORAL_TABLET | Freq: Two times a day (BID) | ORAL | Status: DC
  Administered 2018-07-23 – 2018-07-30 (×14): 12.5 mg via ORAL
  Filled 2018-07-23 (×14): qty 1

## 2018-07-23 MED ORDER — ROCURONIUM BROMIDE 100 MG/10ML IV SOLN
INTRAVENOUS | Status: AC
  Filled 2018-07-23: qty 1

## 2018-07-23 MED ORDER — SUGAMMADEX SODIUM 200 MG/2ML IV SOLN
INTRAVENOUS | Status: AC
  Filled 2018-07-23: qty 2

## 2018-07-23 MED ORDER — HEPARIN SODIUM (PORCINE) 5000 UNIT/ML IJ SOLN
5000.0000 [IU] | Freq: Three times a day (TID) | INTRAMUSCULAR | Status: AC
  Administered 2018-07-23 – 2018-07-26 (×8): 5000 [IU] via SUBCUTANEOUS
  Filled 2018-07-23 (×8): qty 1

## 2018-07-23 MED ORDER — LIDOCAINE 2% (20 MG/ML) 5 ML SYRINGE
INTRAMUSCULAR | Status: AC
  Filled 2018-07-23: qty 5

## 2018-07-23 MED ORDER — FENTANYL CITRATE (PF) 100 MCG/2ML IJ SOLN
25.0000 ug | INTRAMUSCULAR | Status: DC | PRN
  Administered 2018-07-23 (×2): 50 ug via INTRAVENOUS

## 2018-07-23 MED ORDER — DEXAMETHASONE SODIUM PHOSPHATE 10 MG/ML IJ SOLN
INTRAMUSCULAR | Status: AC
  Filled 2018-07-23: qty 1

## 2018-07-23 MED ORDER — SENNA 8.6 MG PO TABS
1.0000 | ORAL_TABLET | Freq: Two times a day (BID) | ORAL | Status: DC
  Administered 2018-07-24 – 2018-07-30 (×13): 8.6 mg via ORAL
  Filled 2018-07-23 (×13): qty 1

## 2018-07-23 MED ORDER — ALBUMIN HUMAN 5 % IV SOLN
INTRAVENOUS | Status: AC
  Filled 2018-07-23: qty 250

## 2018-07-23 MED ORDER — ACETAMINOPHEN 500 MG PO TABS
1000.0000 mg | ORAL_TABLET | Freq: Four times a day (QID) | ORAL | Status: AC
  Administered 2018-07-23 – 2018-07-24 (×4): 1000 mg via ORAL
  Filled 2018-07-23 (×4): qty 2

## 2018-07-23 MED ORDER — LEVOTHYROXINE SODIUM 75 MCG PO TABS
150.0000 ug | ORAL_TABLET | Freq: Every day | ORAL | Status: DC
  Administered 2018-07-24 – 2018-07-30 (×6): 150 ug via ORAL
  Filled 2018-07-23 (×6): qty 2

## 2018-07-23 MED ORDER — DOCUSATE SODIUM 100 MG PO CAPS
100.0000 mg | ORAL_CAPSULE | Freq: Two times a day (BID) | ORAL | Status: DC
  Administered 2018-07-24 – 2018-07-30 (×13): 100 mg via ORAL
  Filled 2018-07-23 (×13): qty 1

## 2018-07-23 MED ORDER — PROPOFOL 10 MG/ML IV BOLUS
INTRAVENOUS | Status: DC | PRN
  Administered 2018-07-23: 100 mg via INTRAVENOUS

## 2018-07-23 MED ORDER — FENTANYL CITRATE (PF) 100 MCG/2ML IJ SOLN
INTRAMUSCULAR | Status: DC | PRN
  Administered 2018-07-23 (×3): 50 ug via INTRAVENOUS
  Administered 2018-07-23: 100 ug via INTRAVENOUS

## 2018-07-23 MED ORDER — FENTANYL CITRATE (PF) 250 MCG/5ML IJ SOLN
INTRAMUSCULAR | Status: AC
  Filled 2018-07-23: qty 5

## 2018-07-23 MED ORDER — DEXTROSE-NACL 5-0.45 % IV SOLN
INTRAVENOUS | Status: AC
  Administered 2018-07-23 – 2018-07-24 (×2): via INTRAVENOUS

## 2018-07-23 MED ORDER — DIPHENHYDRAMINE HCL 50 MG/ML IJ SOLN
INTRAMUSCULAR | Status: DC | PRN
  Administered 2018-07-23: 12.5 mg via INTRAVENOUS

## 2018-07-23 SURGICAL SUPPLY — 62 items
ADH SKN CLS APL DERMABOND .7 (GAUZE/BANDAGES/DRESSINGS)
AGENT HMST KT MTR STRL THRMB (HEMOSTASIS)
BAG LAPAROSCOPIC 12 15 PORT 16 (BASKET) IMPLANT
BAG RETRIEVAL 12/15 (BASKET)
BAG RETRIEVAL 12/15MM (BASKET)
BAG SPEC RTRVL LRG 6X4 10 (ENDOMECHANICALS)
BAG SPEC THK2 15X12 ZIP CLS (MISCELLANEOUS) ×1
BAG ZIPLOCK 12X15 (MISCELLANEOUS) ×3 IMPLANT
BLADE EXTENDED COATED 6.5IN (ELECTRODE) IMPLANT
BLADE SURG SZ10 CARB STEEL (BLADE) ×2 IMPLANT
CABLE HIGH FREQUENCY MONO STRZ (ELECTRODE) ×3 IMPLANT
CHLORAPREP W/TINT 26ML (MISCELLANEOUS) ×5 IMPLANT
CLEANER TIP ELECTROSURG 2X2 (MISCELLANEOUS) IMPLANT
CLIP VESOLOCK LG 6/CT PURPLE (CLIP) ×3 IMPLANT
CLIP VESOLOCK MED LG 6/CT (CLIP) ×2 IMPLANT
CLIP VESOLOCK XL 6/CT (CLIP) IMPLANT
COVER SURGICAL LIGHT HANDLE (MISCELLANEOUS) ×3 IMPLANT
COVER WAND RF STERILE (DRAPES) ×2 IMPLANT
CUTTER FLEX LINEAR 45M (STAPLE) ×2 IMPLANT
DERMABOND ADVANCED (GAUZE/BANDAGES/DRESSINGS)
DERMABOND ADVANCED .7 DNX12 (GAUZE/BANDAGES/DRESSINGS) IMPLANT
DRAIN CHANNEL 10F 3/8 F FF (DRAIN) IMPLANT
DRAPE INCISE IOBAN 66X45 STRL (DRAPES) ×3 IMPLANT
DRSG OPSITE POSTOP 4X8 (GAUZE/BANDAGES/DRESSINGS) ×2 IMPLANT
DRSG TEGADERM 4X4.75 (GAUZE/BANDAGES/DRESSINGS) IMPLANT
ELECT PENCIL ROCKER SW 15FT (MISCELLANEOUS) ×3 IMPLANT
ELECT REM PT RETURN 15FT ADLT (MISCELLANEOUS) ×3 IMPLANT
EVACUATOR SILICONE 100CC (DRAIN) IMPLANT
GLOVE BIO SURGEON STRL SZ 6.5 (GLOVE) ×2 IMPLANT
GLOVE BIO SURGEON STRL SZ7.5 (GLOVE) ×3 IMPLANT
GLOVE BIO SURGEONS STRL SZ 6.5 (GLOVE) ×1
GOWN STRL REUS W/TWL LRG LVL3 (GOWN DISPOSABLE) ×6 IMPLANT
GOWN STRL REUS W/TWL XL LVL3 (GOWN DISPOSABLE) ×3 IMPLANT
HEMOSTAT SURGICEL 4X8 (HEMOSTASIS) IMPLANT
IRRIG SUCT STRYKERFLOW 2 WTIP (MISCELLANEOUS) ×3
IRRIGATION SUCT STRKRFLW 2 WTP (MISCELLANEOUS) IMPLANT
KIT BASIN OR (CUSTOM PROCEDURE TRAY) ×3 IMPLANT
LIGASURE VESSEL 5MM BLUNT TIP (ELECTROSURGICAL) ×2 IMPLANT
POUCH SPECIMEN RETRIEVAL 10MM (ENDOMECHANICALS) IMPLANT
RELOAD 45 VASCULAR/THIN (ENDOMECHANICALS) IMPLANT
RELOAD STAPLE 45 2.5 WHT GRN (ENDOMECHANICALS) IMPLANT
SCISSORS LAP 5X35 DISP (ENDOMECHANICALS) IMPLANT
SPONGE LAP 18X18 5 PK (GAUZE/BANDAGES/DRESSINGS) IMPLANT
STAPLER VISISTAT 35W (STAPLE) ×2 IMPLANT
SURGIFLO W/THROMBIN 8M KIT (HEMOSTASIS) IMPLANT
SUT ETHILON 3 0 PS 1 (SUTURE) IMPLANT
SUT MNCRL AB 4-0 PS2 18 (SUTURE) ×4 IMPLANT
SUT PDS AB 1 TP1 96 (SUTURE) ×4 IMPLANT
SUT VIC AB 2-0 SH 27 (SUTURE)
SUT VIC AB 2-0 SH 27X BRD (SUTURE) IMPLANT
SUT VICRYL 0 UR6 27IN ABS (SUTURE) ×4 IMPLANT
SYS LAPSCP GELPORT 120MM (MISCELLANEOUS) ×3
SYSTEM LAPSCP GELPORT 120MM (MISCELLANEOUS) ×1 IMPLANT
TOWEL OR 17X26 10 PK STRL BLUE (TOWEL DISPOSABLE) ×6 IMPLANT
TRAY FOLEY MTR SLVR 14FR STAT (SET/KITS/TRAYS/PACK) ×2 IMPLANT
TRAY FOLEY MTR SLVR 16FR STAT (SET/KITS/TRAYS/PACK) ×1 IMPLANT
TRAY LAPAROSCOPIC (CUSTOM PROCEDURE TRAY) ×3 IMPLANT
TROCAR BLADELESS OPT 5 100 (ENDOMECHANICALS) ×2 IMPLANT
TROCAR UNIVERSAL OPT 12M 100M (ENDOMECHANICALS) ×3 IMPLANT
TROCAR XCEL 12X100 BLDLESS (ENDOMECHANICALS) ×3 IMPLANT
TUBING INSUF HEATED (TUBING) ×3 IMPLANT
YANKAUER SUCT BULB TIP 10FT TU (MISCELLANEOUS) ×1 IMPLANT

## 2018-07-23 NOTE — H&P (Signed)
CC/HPI: Cc: History of right ureteral calculus, nonfunctional kidney  HPI:  82 year old female status post urgent ureteral stent placement on the right for an obstructing ureteral calculus with concern for sepsis. She socially underwent an attempted ureteroscopy but there was found to be a blockage from tissue that appeared to be possible mass. The stone was unable to be accessed even though she had been stented. I biopsied the tissue and this came back to be benign urothelium. She continues with a ureteral stent. I obtained a renal scan with Lasix. This unfortunately revealed only 10% function in the right kidney. The patient herself is doing well currently. She has no complaints. She has occasional right-sided flank pain but none currently.     ALLERGIES: CODEINE    MEDICATIONS: Azithromycin 250 mg tablet 1 tablet PO Daily  Brimonidine Tartrate 0.2 % drops 1 PO Daily  Dorzolamide-Timolol 22.3 mg-6.8 mg/ml drops 1 PO Daily  Levothyroxine Sodium 150 mcg tablet 1 tablet PO Daily  Lumigan 0.01 % drops 1 PO Daily  Meclizine Hcl 12.5 mg tablet 1 tablet PO Daily  Oxybutynin Chloride Er 5 mg tablet, extended release 24 hr 1 tablet PO Daily  Ventolin Hfa 90 mcg hfa aerosol with adapter 1 PO Daily  Xarelto 20 mg tablet 1 tablet PO Daily     GU PSH: Cysto Uretero Biopsy Fulgura, Right - 04/16/2018 Cystoscopy Insert Stent, Right - 03/11/2018 Ureteroscopic laser litho, Right - 04/16/2018    NON-GU PSH: None   GU PMH: Acute Cystitis/UTI - 04/05/2018 Renal calculus - 04/05/2018      PMH Notes:  1898-08-22 00:00:00 - Note: Normal Routine History And Physical Senior Citizen (65-80)  Pt states that she has Lupus. Hx of Heart Dz.   NON-GU PMH: Glaucoma Hypertension Pulmonary Embolism, History    FAMILY HISTORY: 2 daughters - Daughter 5 sons - Son Alzheimer's Disease - Sister, Uncle Colon Cancer - Son Death - Father, Mother Heart Attack - Father, Mother   SOCIAL HISTORY: Marital Status:  Widowed Preferred Language: English; Ethnicity: Not Hispanic Or Latino; Race: Black or African American Current Smoking Status: Patient has never smoked.   Tobacco Use Assessment Completed: Used Tobacco in last 30 days? Does not drink caffeine. Patient's occupation is/was Retired.    REVIEW OF SYSTEMS:    GU Review Female:   Patient denies frequent urination, hard to postpone urination, burning /pain with urination, get up at night to urinate, leakage of urine, stream starts and stops, trouble starting your stream, have to strain to urinate, and being pregnant.  Gastrointestinal (Upper):   Patient denies nausea, vomiting, and indigestion/ heartburn.  Gastrointestinal (Lower):   Patient denies diarrhea and constipation.  Constitutional:   Patient denies fever, night sweats, weight loss, and fatigue.  Skin:   Patient denies skin rash/ lesion and itching.  Eyes:   Patient denies blurred vision and double vision.  Ears/ Nose/ Throat:   Patient denies sore throat and sinus problems.  Hematologic/Lymphatic:   Patient denies swollen glands and easy bruising.  Cardiovascular:   Patient denies leg swelling and chest pains.  Respiratory:   Patient denies cough and shortness of breath.  Endocrine:   Patient denies excessive thirst.  Musculoskeletal:   Patient denies back pain and joint pain.  Neurological:   Patient denies headaches and dizziness.  Psychologic:   Patient denies depression and anxiety.   VITAL SIGNS:      05/16/2018 03:12 PM  Weight 210 lb / 95.25 kg  Height 66 in / 167.64  cm  BP 143/83 mmHg  Pulse 79 /min  Temperature 97.5 F / 36.3 C  BMI 33.9 kg/m   MULTI-SYSTEM PHYSICAL EXAMINATION:    Constitutional: Well-nourished. No physical deformities. Normally developed. Good grooming.  Respiratory: No labored breathing, no use of accessory muscles.   Cardiovascular: Normal temperature, adequate perfusion of extremities  Skin: No paleness, no jaundice  Neurologic / Psychiatric:  Oriented to time, oriented to place, oriented to person. No depression, no anxiety, no agitation.  Gastrointestinal: No mass, no tenderness, no rigidity, obese abdomen. I do not see any obvious abdominal scars  Eyes: Normal conjunctivae. Normal eyelids.  Musculoskeletal: Normal gait and station of head and neck.     PAST DATA REVIEWED:  Source Of History:  Patient  X-Ray Review: Lasix Renogram: Reviewed Films. Reviewed Report. Discussed With Patient.     PROCEDURES:          Urinalysis w/Scope - 81001 Dipstick Dipstick Cont'd Micro  Color: Yellow Bilirubin: Neg mg/dL WBC/hpf: 6 - 16/XWR  Appearance: Cloudy Ketones: Neg mg/dL RBC/hpf: 3 - 60/AVW  Specific Gravity: 1.020 Blood: 3+ ery/uL Bacteria: Mod (26-50/hpf)  pH: 6.0 Protein: Trace mg/dL Cystals: NS (Not Seen)  Glucose: Neg mg/dL Urobilinogen: 0.2 mg/dL Casts: NS (Not Seen)    Nitrites: Neg Trichomonas: Not Present    Leukocyte Esterase: Trace leu/uL Mucous: Not Present      Epithelial Cells: 20 - 40/hpf      Yeast: NS (Not Seen)      Sperm: Not Present    ASSESSMENT:      ICD-10 Details  1 GU:   Renal calculus - N20.0   2   Atrophy of kidney - N26.1    PLAN:           Orders Labs Urine Culture          Document Letter(s):  Created for Patient: Clinical Summary         Notes:   I recommend right hand assisted laparoscopic simple nephrectomy given the minimal function and the fact that she has persistent calculus. She will need to have clearance to come off her blood thinner considering the fact that she had a pulmonary embolus. She understands potential risks including but not limited to bleeding, infection, injury to surrounding structures, need for additional procedures.

## 2018-07-23 NOTE — Transfer of Care (Signed)
Immediate Anesthesia Transfer of Care Note  Patient: Traci Wilson  Procedure(s) Performed: Procedure(s): RIGHT HAND ASSISTED LAPAROSCOPIC NEPHRECTOMY (Right)  Patient Location: PACU   Anesthesia Type:General  Level of Consciousness:  sedated, patient cooperative and responds to stimulation  Airway & Oxygen Therapy:Patient Spontanous Breathing and Patient connected to face mask oxgen  Post-op Assessment:  Report given to PACU RN and Post -op Vital signs reviewed and stable  Post vital signs:  Reviewed and stable  Last Vitals:  Vitals:   07/23/18 0604  BP: (!) 152/82  Pulse: 65  Resp: 18  Temp: 36.7 C  SpO2: 100%    Complications: No apparent anesthesia complications

## 2018-07-23 NOTE — Anesthesia Procedure Notes (Signed)
Procedure Name: Intubation Date/Time: 07/23/2018 7:51 AM Performed by: Anne Fu, CRNA Pre-anesthesia Checklist: Patient identified, Emergency Drugs available, Suction available, Patient being monitored and Timeout performed Patient Re-evaluated:Patient Re-evaluated prior to induction Oxygen Delivery Method: Circle system utilized Preoxygenation: Pre-oxygenation with 100% oxygen Induction Type: IV induction Ventilation: Mask ventilation without difficulty Laryngoscope Size: Mac and 4 Grade View: Grade I Tube type: Oral Tube size: 7.5 mm Number of attempts: 1 Airway Equipment and Method: Stylet Placement Confirmation: ETT inserted through vocal cords under direct vision,  positive ETCO2 and breath sounds checked- equal and bilateral Secured at: 21 cm Tube secured with: Tape Dental Injury: Teeth and Oropharynx as per pre-operative assessment

## 2018-07-23 NOTE — Discharge Instructions (Signed)

## 2018-07-23 NOTE — Anesthesia Postprocedure Evaluation (Signed)
Anesthesia Post Note  Patient: Traci Wilson  Procedure(s) Performed: RIGHT HAND ASSISTED LAPAROSCOPIC NEPHRECTOMY (Right Abdomen)     Patient location during evaluation: PACU Anesthesia Type: General Level of consciousness: sedated Pain management: pain level controlled Vital Signs Assessment: post-procedure vital signs reviewed and stable Respiratory status: spontaneous breathing and respiratory function stable Cardiovascular status: stable Postop Assessment: no apparent nausea or vomiting Anesthetic complications: no    Last Vitals:  Vitals:   07/23/18 1130 07/23/18 1145  BP: (!) 176/76 (!) 167/79  Pulse: 60 68  Resp: (!) 8 10  Temp:  36.4 C  SpO2: 97% 98%    Last Pain:  Vitals:   07/23/18 1145  TempSrc:   PainSc: Asleep                 Louisa Favaro DANIEL

## 2018-07-23 NOTE — Op Note (Signed)
Operative Note  Preoperative diagnosis:  1.  Right nonfunctional atrophic right kidney with a history of pyelonephritis  Postoperative diagnosis: 1.  Same  Procedure(s): 1.  Right hand assisted laparoscopic nephrectomy  Surgeon: Modena Slater, MD  Assistants: Seward Meth, an assistant was needed throughout the case further expertise in assisting with a laparoscopic surgery, including visualization with the camera, passing instruments, etc.  Anesthesia: General  Complications: None  EBL: 100 cc  Specimens: 1.  Right kidney  Drains/Catheters: 1. Foley catheter  Intraoperative findings: Right kidney removed entirely  Indication: 82 year old female who was found on imaging to have a right proximal ureteral calculus.  Ureteroscopy failed to take care of the stone since there was chronic inflammation around it and I was unable to access the stone.  Biopsies of this area had no evidence of carcinoma.  Lasix renal scan revealed a split function of 10% in the right kidney.  After discussion of different options, the patient elected to undergo the above operation.  Description of procedure:  The patient was identified and consent was obtained.  The patient was taken to the operating room and placed in the supine position.  The patient was placed under general anesthesia.  Perioperative antibiotics were administered.  The patient was placed in right lateral position at approximately 65 degrees and all pressure points were padded.  Patient was prepped and draped in a standard sterile fashion and a timeout was performed.  An 8 cm periumbilical incision was made sharply into the skin.  This was carried down with Bovie electrocautery down to the anterior rectus sheath which was divided with electrocautery.  The underlying musculature was separated in the midline.  Sharp dissection with Metzenbaum scissors was used to open up the posterior sheath and peritoneum.  This was extended with  electrocautery taking great care not to use cautery near the bowel.  The hand assist port was secured into the incision.  I made sure no bowel was trapped within this.  A 12 mm port was inserted through the hand assist port and the abdomen was insufflated to a pressure of 15.  A 12 mm port was placed lateral as well as superior to the hand assist port, each 1 about a hand width away. A 5 mm port was placed superior to the midline port and used for liver retraction.  Please note that all ports were placed under direct visualization with the camera.  The colon was first dissected medially by incising along the white line of Toldt.  After medializing the colon, the kidney was dissected laterally and medially as well as superiorly.  Inferior attachments as well as the ureter and gonadal vein were divided and the ureteral stent was withdrawn.  A clip was then placed on the more distal portion of the ureter. I continued to carefully dissect medially and identified the renal hilum.  The renal vein and renal artery were divided with a 45 mm vascular staple load.  Superior attachments were then released using LigaSure device as well as blunt dissection.  Once the entire kidney and surrounding Gerota's fascia was freed, the specimen was withdrawn from the midline incision and passed off for permanent specimen.  The abdomen was reinspected and no active bleeding was noted.  Surgicel was applied to the nephrectomy bed.  The midline fascia was closed with running 0 looped PDS suture followed by staples.  The port incisions were closed with staples.  Exparel was instilled for anesthetic effect.  A dressing was applied.  Patient tolerated the procedure well and was stable postoperatively.  Plan: Stat labs will be obtained.  Anticipate the patient will be in the hospital 1-2 nights as long as he does well.  I will plan to initiate early postoperative DVT prophylaxis with heparin subcu given her history of pulmonary embolus

## 2018-07-24 ENCOUNTER — Encounter (HOSPITAL_COMMUNITY): Payer: Self-pay | Admitting: Urology

## 2018-07-24 LAB — CBC
HEMATOCRIT: 35.9 % — AB (ref 36.0–46.0)
HEMOGLOBIN: 11 g/dL — AB (ref 12.0–15.0)
MCH: 26.9 pg (ref 26.0–34.0)
MCHC: 30.6 g/dL (ref 30.0–36.0)
MCV: 87.8 fL (ref 80.0–100.0)
Platelets: 238 10*3/uL (ref 150–400)
RBC: 4.09 MIL/uL (ref 3.87–5.11)
RDW: 14.3 % (ref 11.5–15.5)
WBC: 10 10*3/uL (ref 4.0–10.5)
nRBC: 0 % (ref 0.0–0.2)

## 2018-07-24 LAB — BASIC METABOLIC PANEL
Anion gap: 7 (ref 5–15)
BUN: 22 mg/dL (ref 8–23)
CALCIUM: 8.8 mg/dL — AB (ref 8.9–10.3)
CO2: 22 mmol/L (ref 22–32)
Chloride: 106 mmol/L (ref 98–111)
Creatinine, Ser: 1.36 mg/dL — ABNORMAL HIGH (ref 0.44–1.00)
GFR calc Af Amer: 42 mL/min — ABNORMAL LOW (ref >60)
GFR calc non Af Amer: 37 mL/min — ABNORMAL LOW (ref >60)
Glucose, Bld: 170 mg/dL — ABNORMAL HIGH (ref 70–99)
Potassium: 4.2 mmol/L (ref 3.5–5.1)
Sodium: 135 mmol/L (ref 135–145)

## 2018-07-24 MED ORDER — ACETAMINOPHEN 10 MG/ML IV SOLN
1000.0000 mg | Freq: Four times a day (QID) | INTRAVENOUS | Status: AC
  Administered 2018-07-24 – 2018-07-25 (×3): 1000 mg via INTRAVENOUS
  Filled 2018-07-24 (×3): qty 100

## 2018-07-24 MED ORDER — DEXTROSE-NACL 5-0.45 % IV SOLN
INTRAVENOUS | Status: AC
  Administered 2018-07-24: 13:00:00 via INTRAVENOUS

## 2018-07-24 NOTE — Progress Notes (Signed)
Urology Progress Note   1 Day Post-Op  Subjective: NAEON. Patient reports pain, but did not receive tramadol overnight. Denies nausea, tolerated jello.   Objective: Vital signs in last 24 hours: Temp:  [97.6 F (36.4 C)-98.4 F (36.9 C)] 97.7 F (36.5 C) (12/03 0553) Pulse Rate:  [57-89] 67 (12/03 0553) Resp:  [8-16] 12 (12/03 0553) BP: (130-176)/(66-90) 136/66 (12/03 0553) SpO2:  [96 %-100 %] 99 % (12/03 0553)  Intake/Output from previous day: 12/02 0701 - 12/03 0700 In: 2460 [P.O.:60; I.V.:2050; IV Piggyback:350] Out: 925 [Urine:875; Blood:50] Intake/Output this shift: No intake/output data recorded.  Physical Exam:  General: Alert and oriented CV: hemodynamically stable Lungs: NWOB on  AFB Abdomen: Soft, tender to palpation, incisions dressed.  GU: Foley in place draining clear yellow urine Ext: NT, No erythema  Lab Results: Recent Labs    07/23/18 1057 07/24/18 0542  HGB 11.8* 11.0*  HCT 39.1 35.9*   BMET Recent Labs    07/23/18 1057 07/24/18 0542  NA 139 135  K 3.9 4.2  CL 108 106  CO2 23 22  GLUCOSE 117* 170*  BUN 27* 22  CREATININE 1.31* 1.36*  CALCIUM 9.0 8.8*     Studies/Results: No results found.  Assessment/Plan:  82 y.o. female s/p right hand assisted laparoscopic radical nephrectomy on 07/23/18. Overall doing well post-op.   - Reinforced requesting prn pain medications when she is having pain. - Advance to regular diet. - Continue IVF at 50 mL/hr. - Discontinue foley, perform trial of void. - Ambulate x 4 today.  - Requires ongoing inpatient care. Possible discharge tomorrow vs Wed pending clinical recovery.  - Wean oxygen as tolerated.     LOS: 1 day   Elyn AquasKathryn H Karlisa Gaubert 07/24/2018, 7:19 AM

## 2018-07-25 MED ORDER — DIAZEPAM 5 MG PO TABS
5.0000 mg | ORAL_TABLET | Freq: Every day | ORAL | Status: DC
  Administered 2018-07-25 – 2018-07-29 (×5): 5 mg via ORAL
  Filled 2018-07-25 (×5): qty 1

## 2018-07-25 MED ORDER — DEXTROSE-NACL 5-0.45 % IV SOLN
INTRAVENOUS | Status: DC
  Administered 2018-07-25: 09:00:00 via INTRAVENOUS

## 2018-07-25 MED ORDER — RIVAROXABAN 20 MG PO TABS
20.0000 mg | ORAL_TABLET | Freq: Every day | ORAL | Status: DC
  Administered 2018-07-26 – 2018-07-29 (×4): 20 mg via ORAL
  Filled 2018-07-25 (×4): qty 1

## 2018-07-25 MED ORDER — ACETAMINOPHEN 500 MG PO TABS
1000.0000 mg | ORAL_TABLET | Freq: Four times a day (QID) | ORAL | Status: DC
  Administered 2018-07-25 – 2018-07-30 (×17): 1000 mg via ORAL
  Filled 2018-07-25 (×20): qty 2

## 2018-07-25 MED ORDER — FAMOTIDINE 20 MG PO TABS
20.0000 mg | ORAL_TABLET | Freq: Two times a day (BID) | ORAL | Status: DC
  Administered 2018-07-25 – 2018-07-30 (×11): 20 mg via ORAL
  Filled 2018-07-25 (×11): qty 1

## 2018-07-25 NOTE — Progress Notes (Signed)
Pt did not want to ambulate again this afternoon after encouragement from RN and NT. Pt stated she was tired and wanted to "rest" in bed. Pt did sit up in chair a while and ambulate with PT.

## 2018-07-25 NOTE — Discharge Summary (Addendum)
[facility] Discharge Summary  Admit date: 07/23/2018  Discharge date and time: 07/30/18   Discharge to: Home  Discharge Service: Urology  Discharge Attending Physician:  Modena SlaterEugene Brylen Wagar, MD  Discharge  Diagnoses: Right non-functioning kidney   Secondary Diagnosis: Active Problems:   Atrophic kidney   OR Procedures: Procedure(s): RIGHT HAND ASSISTED LAPAROSCOPIC NEPHRECTOMY 07/23/2018   Ancillary Procedures: None   Discharge Day Services: The patient was seen and examined by the Urology team both in the morning prior to discharge.  Vital signs and laboratory values were stable and within normal limits.  The physical exam was benign and unchanged and all surgical wounds were examined.  Discharge instructions were explained and all questions answered.  Subjective  No acute events overnight. Pain Controlled. No fever or chills.  Objective No data found. Total I/O In: 240 [P.O.:240] Out: 300 [Urine:300]  General Appearance:        No acute distress Lungs:                       Normal work of breathing on room air Heart:                                Regular rate and rhythm Abdomen:                         Soft, non-tender, non-distended, incisions c/d/i with staples.  Extremities:                      Warm and well perfused   Hospital Course:  The patient underwent right hand assisted laparoscopic radical nephrectomy on 07/23/2018.  The patient tolerated the procedure well, was extubated in the OR, and afterwards was taken to the PACU for routine post-surgical care. When stable the patient was transferred to the floor.   The patient did well postoperatively.  The patient's diet was slowly advanced and at the time of discharge was tolerating a regular diet.  The patient was discharged on POD7, at which point was tolerating a regular solid diet, was able to void spontaneously, have adequate pain control with P.O. pain medication, and could ambulate without difficulty. The patient  was discharged to a SNF due to PT recommendations for ongoing supervision following discharge and the fact that the patient would be at home by herself during the day. The patient will follow up with us for post op check and staples removal.   Condition at Discharge: Improved  Discharge Medications:  Allergies as of 07/30/2018      Reactions   Codeine Other (See Comments)   Unknown reaction      Medication List    STOP taking these medications   amoxicillin-clavulanate 875-125 MG tablet Commonly known as:  AUGMENTIN   doxycycline 100 MG tablet Commonly known as:  VIBRA-TABS   traMADol 50 MG tablet Commonly known as:  ULTRAM     TAKE these medications   brimonidine 0.2 % ophthalmic solution Commonly known as:  ALPHAGAN Place 1 drop into both eyes 2 (two) times daily.   dorzolamide-timolol 22.3-6.8 MG/ML ophthalmic solution Commonly known as:  COSOPT Place 1 drop into both eyes 2 (two) times daily.   latanoprost 0.005 % ophthalmic solution Commonly known as:  XALATAN Place 1 drop into both eyes at bedtime.   levothyroxine 150 MCG tablet Commonly known as:  SYNTHROID, LEVOTHROID Take 150  mcg by mouth daily before breakfast.   meclizine 12.5 MG tablet Commonly known as:  ANTIVERT Take 12.5 mg by mouth 2 (two) times daily.   rivaroxaban 20 MG Tabs tablet Commonly known as:  XARELTO Take 1 tablet (20 mg total) by mouth daily with supper.     ASK your doctor about these medications   traMADol 50 MG tablet Commonly known as:  ULTRAM Take 1 tablet (50 mg total) by mouth every 6 (six) hours as needed for up to 5 days for severe pain. Ask about: Should I take this medication?

## 2018-07-25 NOTE — Progress Notes (Addendum)
Urology Progress Note   2 Days Post-Op  Subjective: NAEON. Patient reports ongoing abdominal pain, but seems to be improving. Main complaint is stomach pain after eating. Was OOB ambulating and to chair yesterday. Voiding after foley removal with low PVR. Tolerating diet, denies nausea. Describes poor fluid intake.   Objective: Vital signs in last 24 hours: Temp:  [97.9 F (36.6 C)-98.2 F (36.8 C)] 98 F (36.7 C) (12/04 0409) Pulse Rate:  [54-72] 62 (12/04 0409) Resp:  [16-18] 16 (12/04 0409) BP: (129-150)/(57-68) 140/68 (12/04 0409) SpO2:  [96 %-99 %] 98 % (12/04 0409)  Intake/Output from previous day: 12/03 0701 - 12/04 0700 In: 2585 [P.O.:480; I.V.:491.7; IV Piggyback:1613.3] Out: 1600 [Urine:1600] Intake/Output this shift: Total I/O In: 1774.2 [I.V.:160.8; IV Piggyback:1613.3] Out: 300 [Urine:300]  Physical Exam:  General: Alert and oriented CV: hemodynamically stable Lungs: NWOB Abdomen: Soft, appropriately tender to palpation, incisions dressed.  GU: voiding spontaneously Ext: NT, No erythema  Lab Results: Recent Labs    07/23/18 1057 07/24/18 0542  HGB 11.8* 11.0*  HCT 39.1 35.9*   BMET Recent Labs    07/23/18 1057 07/24/18 0542  NA 139 135  K 3.9 4.2  CL 108 106  CO2 23 22  GLUCOSE 117* 170*  BUN 27* 22  CREATININE 1.31* 1.36*  CALCIUM 9.0 8.8*     Studies/Results: No results found.  Assessment/Plan:  82 y.o. female s/p right hand assisted laparoscopic radical nephrectomy on 07/23/18. Overall doing well post-op.   - Multimodal pain regimen. - Continue regular diet. - Continue IVF at 50 mL/hr pending improved po intake. Encourage increased fluid intake today.  - Ambulate x 4 today.  - Physical therapy evaluation pending.  - Requires ongoing inpatient care. Possible discharge tomorrow pending clinical recovery.  - Consider restarting therapeutic anticoagulation given patient history.  - Given pain after eating, start famotidine today and  monitor for improvement.     LOS: 2 days   Elyn AquasKathryn H Dayan Desa 07/25/2018, 6:01 AM

## 2018-07-25 NOTE — Evaluation (Signed)
Physical Therapy Evaluation Patient Details Name: Traci Wilson MRN: [id number] DOB: 01/16/1936 Today's Date: 07/25/2018   History of Present Illness  Patient is an 82 y/o female admitted for right hand assisted laparoscopic simple nephrectomy given the minimal function and the fact that she has persistent calculus.   PMH positive for PE, MVP, fibromyalgia, lupus and HTN.   Clinical Impression  Patient presents with decreased independence with mobility due to pain, limited activity tolerance and generalized weakness.  She will benefit from skilled PT in the acute setting to allow d/c home with family support.  Ideal d/c right before weekend so daughter in law can assist during transition home prior to return to work.  Encouraged ambulation in hallway when nursing in to assist to bathroom.     Follow Up Recommendations Home health PT;Supervision for mobility/OOB    Equipment Recommendations  None recommended by PT    Recommendations for Other Services       Precautions / Restrictions Precautions Precautions: Fall      Mobility  Bed Mobility               General bed mobility comments: in chair, discussed options for bed mobility after surgery through abdomen  Transfers Overall transfer level: Needs assistance Equipment used: Rolling walker (2 wheeled) Transfers: Sit to/from Stand Sit to Stand: Min guard;Min assist         General transfer comment: increased time, heavy UE support on armrests, increased time to scoot to edge of chair, uncontrolled descent back to chair with min A for safety/lowering  Ambulation/Gait Ambulation/Gait assistance: Min guard;Min assist Gait Distance (Feet): 100 Feet Assistive device: Rolling walker (2 wheeled) Gait Pattern/deviations: Step-to pattern;Step-through pattern;Decreased stride length;Trunk flexed;Shuffle     General Gait Details: slow pace, minguard for safety; c/o increased abdominal pain with turning walker so assist on  second turn for walker  Stairs            Wheelchair Mobility    Modified Rankin (Stroke Patients Only)       Balance Overall balance assessment: Needs assistance   Sitting balance-Leahy Scale: Good       Standing balance-Leahy Scale: Fair Standing balance comment: static standing without walker                             Pertinent Vitals/Pain Pain Assessment: 0-10 Pain Score: 9  Pain Location: in abdomen at surgical site and bilateral groin area Pain Descriptors / Indicators: Tender;Discomfort;Sharp Pain Intervention(s): Monitored during session;Repositioned;Premedicated before session    Home Living Family/patient expects to be discharged to:: Private residence Living Arrangements: Children Available Help at Discharge: Family;Available PRN/intermittently(daughter in law works but will be available on weekends, daughter can also help some) Type of Home: House       Home Layout: One level Home Equipment: Environmental consultantWalker - 2 wheels      Prior Function Level of Independence: Independent               Hand Dominance        Extremity/Trunk Assessment   Upper Extremity Assessment Upper Extremity Assessment: Overall WFL for tasks assessed    Lower Extremity Assessment Lower Extremity Assessment: Generalized weakness       Communication   Communication: No difficulties  Cognition Arousal/Alertness: Awake/alert Behavior During Therapy: WFL for tasks assessed/performed Overall Cognitive Status: Within Functional Limits for tasks assessed  General Comments      Exercises     Assessment/Plan    PT Assessment Patient needs continued PT services  PT Problem List Decreased strength;Decreased mobility;Pain;Decreased activity tolerance;Decreased knowledge of use of DME;Decreased safety awareness       PT Treatment Interventions DME instruction;Therapeutic activities;Gait  training;Therapeutic exercise;Patient/family education;Functional mobility training;Stair training    PT Goals (Current goals can be found in the Care Plan section)  Acute Rehab PT Goals Patient Stated Goal: to go home  PT Goal Formulation: With patient Time For Goal Achievement: 08/08/18 Potential to Achieve Goals: Good    Frequency Min 3X/week   Barriers to discharge        Co-evaluation               AM-PAC PT "6 Clicks" Mobility  Outcome Measure Help needed turning from your back to your side while in a flat bed without using bedrails?: A Lot Help needed moving from lying on your back to sitting on the side of a flat bed without using bedrails?: A Lot Help needed moving to and from a bed to a chair (including a wheelchair)?: A Little Help needed standing up from a chair using your arms (e.g., wheelchair or bedside chair)?: A Little Help needed to walk in hospital room?: A Little Help needed climbing 3-5 steps with a railing? : A Little 6 Click Score: 16    End of Session   Activity Tolerance: Patient limited by pain Patient left: in chair;with call bell/phone within reach   PT Visit Diagnosis: Other abnormalities of gait and mobility (R26.89);Muscle weakness (generalized) (M62.81)    Time: 4098-1191 PT Time Calculation (min) (ACUTE ONLY): 27 min   Charges:   PT Evaluation $PT Eval Moderate Complexity: 1 Mod PT Treatments $Gait Training: 8-22 mins        Sheran Lawless, PT Acute Rehabilitation Services [phone] 07/25/2018   Elray Mcgregor 07/25/2018, 11:06 AM

## 2018-07-26 NOTE — Progress Notes (Signed)
RN received a phone call from one of the patient's sons today. He states that he is concerned about patient's safety at home. States the patient lives with his wife who is a Engineer, siteschool teacher and is gone from the home 9-12 hours/day and during that time the patient is alone. Patient's son stated that the patient will not share this information with staff. RN did inform patient's son that he was not listed as an emergency contact and that she could not share information with him about the patient. He stated that he understood. RN did state that she would relay this information to Dr. Alvester MorinBell. Son requested Dr. Shannan HarperBell's phone number, RN gave him the office contact number. Orders placed for CSW consult and to have PT re-evaluate the patient.   During this morning's assessment, this RN was told by the patient that she lives with her daughter-in-law and that she is there to help her. RN did ask the patient is someone would be available to help her all the time while at home and the patient stated that yes her daughter-in-law would be there to help her.    Nobel Brar, Joyce CopaJessica L 07/26/2018

## 2018-07-26 NOTE — Progress Notes (Signed)
Urology Inpatient Progress Report  RIGHT NON FUNCTIONAL KIDNEY  Procedure(s): RIGHT HAND ASSISTED LAPAROSCOPIC NEPHRECTOMY  3 Days Post-Op   Intv/Subj: No acute events overnight. Patient is without complaint. Continuing to have some pain related to ambulation and some decreased p.o. intake but it seems to be improving.  No bowel movement but she is passing gas.  Active Problems:   Atrophic kidney  Current Facility-Administered Medications  Medication Dose Route Frequency Provider Last Rate Last Dose  . acetaminophen (TYLENOL) tablet 1,000 mg  1,000 mg Oral Q6H Elyn Aquas, MD   1,000 mg at 07/26/18 0631  . brimonidine (ALPHAGAN) 0.2 % ophthalmic solution 1 drop  1 drop Both Eyes BID Elyn Aquas, MD   1 drop at 07/25/18 2116  . diazepam (VALIUM) tablet 5 mg  5 mg Oral QHS Ray Church III, MD   5 mg at 07/25/18 2107  . docusate sodium (COLACE) capsule 100 mg  100 mg Oral BID Elyn Aquas, MD   100 mg at 07/25/18 2108  . dorzolamide-timolol (COSOPT) 22.3-6.8 MG/ML ophthalmic solution 1 drop  1 drop Both Eyes BID Elyn Aquas, MD   1 drop at 07/25/18 2116  . famotidine (PEPCID) tablet 20 mg  20 mg Oral BID Elyn Aquas, MD   20 mg at 07/25/18 2110  . hydrALAZINE (APRESOLINE) injection 10 mg  10 mg Intravenous Q8H PRN Elyn Aquas, MD      . latanoprost (XALATAN) 0.005 % ophthalmic solution 1 drop  1 drop Both Eyes QHS Elyn Aquas, MD   1 drop at 07/25/18 2116  . levothyroxine (SYNTHROID, LEVOTHROID) tablet 150 mcg  150 mcg Oral Q0600 Elyn Aquas, MD   150 mcg at 07/26/18 0631  . meclizine (ANTIVERT) tablet 12.5 mg  12.5 mg Oral BID Elyn Aquas, MD   12.5 mg at 07/25/18 2109  . morphine 2 MG/ML injection 1-2 mg  1-2 mg Intravenous Q2H PRN Elyn Aquas, MD   2 mg at 07/23/18 1351  . ondansetron (ZOFRAN) injection 4 mg  4 mg Intravenous Q4H PRN Elyn Aquas, MD      . rivaroxaban Carlena Hurl) tablet 20 mg  20 mg Oral Q  supper Elyn Aquas, MD      . senna [facility]) tablet 8.6 mg  1 tablet Oral BID Elyn Aquas, MD   8.6 mg at 07/25/18 2108  . traMADol (ULTRAM) tablet 50 mg  50 mg Oral Q6H PRN Elyn Aquas, MD   50 mg at 07/26/18 9147   Or  . traMADol (ULTRAM) tablet 100 mg  100 mg Oral Q6H PRN Elyn Aquas, MD   100 mg at 07/25/18 1231     Objective: Vital: Vitals:   07/25/18 0409 07/25/18 1327 07/25/18 2019 07/26/18 0440  BP: 140/68 129/60 118/60 127/65  Pulse: 62 64 75 60  Resp: 16 16 16 16   Temp: 98 F (36.7 C) 97.6 F (36.4 C) 97.7 F (36.5 C) 97.8 F (36.6 C)  TempSrc: Oral Oral Oral Oral  SpO2: 98% 95% 96% 96%  Weight:      Height:       I/Os: I/O last 3 completed shifts: In: 3113.3 [P.O.:480; I.V.:1020; IV Piggyback:1613.3] Out: 1150 [Urine:1150]  Physical Exam:  General: Patient is in no apparent distress Lungs: Normal respiratory effort, chest expands symmetrically. GI: Incisions are c/d/i.  The abdomen is soft and appropriately tender without mass. Ext: lower extremities symmetric  Lab Results: Recent Labs  07/23/18 1057 07/24/18 0542  WBC 12.7* 10.0  HGB 11.8* 11.0*  HCT 39.1 35.9*   Recent Labs    07/23/18 1057 07/24/18 0542  NA 139 135  K 3.9 4.2  CL 108 106  CO2 23 22  GLUCOSE 117* 170*  BUN 27* 22  CREATININE 1.31* 1.36*  CALCIUM 9.0 8.8*   No results for input(s): LABPT, INR in the last 72 hours. No results for input(s): LABURIN in the last 72 hours. Results for orders placed or performed during the hospital encounter of 03/08/18  Surgical PCR screen     Status: None   Collection Time: 03/11/18  6:11 AM  Result Value Ref Range Status   MRSA, PCR NEGATIVE NEGATIVE Final   Staphylococcus aureus NEGATIVE NEGATIVE Final    Comment: (NOTE) The Xpert SA Assay (FDA approved for NASAL specimens in patients 80 years of age and older), is one component of a comprehensive surveillance program. It is not intended to diagnose  infection nor to guide or monitor treatment. Performed at [facility], [address] [place], [place] [address]   Urine Culture     Status: Abnormal   Collection Time: 03/11/18  9:41 AM  Result Value Ref Range Status   Specimen Description   Final    CYSTOSCOPY Performed at [facility], [address] [place], [place] [address]    Special Requests   Final    NONE Performed at [facility], [address] [place], [place] [address]    Culture >=100,000 COLONIES/mL ESCHERICHIA COLI (A)  Final   Report Status 03/14/2018 FINAL  Final   Organism ID, Bacteria ESCHERICHIA COLI (A)  Final      Susceptibility   Escherichia coli - MIC*    AMPICILLIN >=32 RESISTANT Resistant     CEFAZOLIN <=4 SENSITIVE Sensitive     CEFEPIME <=1 SENSITIVE Sensitive     CEFTAZIDIME <=1 SENSITIVE Sensitive     CEFTRIAXONE <=1 SENSITIVE Sensitive     CIPROFLOXACIN >=4 RESISTANT Resistant     GENTAMICIN <=1 SENSITIVE Sensitive     IMIPENEM <=0.25 SENSITIVE Sensitive     TRIMETH/SULFA <=20 SENSITIVE Sensitive     AMPICILLIN/SULBACTAM 16 INTERMEDIATE Intermediate     PIP/TAZO <=4 SENSITIVE Sensitive     Extended ESBL NEGATIVE Sensitive     * >=100,000 COLONIES/mL ESCHERICHIA COLI    Studies/Results: No results found.  Assessment: Right atrophic kidney Procedure(s): RIGHT HAND ASSISTED LAPAROSCOPIC NEPHRECTOMY, 3 Days Post-Op  doing well.  Plan: Start back on home Xarelto. Ambulate, physical therapy Stop fluids Anticipate home tomorrow   Modena SlaterEugene Bell, MD Urology 07/26/2018, 7:58 AM

## 2018-07-26 NOTE — Care Management Note (Signed)
Case Management Note  Patient Details  Name: Traci Wilson MRN: [id number] Date of Birth: 01/11/1936  Subjective/Objective: Atrophic kidney. PT recc HHPT-provided patient w/HHC agency list per medicare guidelines.Await choice.                   Action/Plan:d/c home w/HHC.   Expected Discharge Date:  07/25/18               Expected Discharge Plan:  Home w Home Health Services  In-House Referral:     Discharge planning Services  CM Consult  Post Acute Care Choice:  Durable Medical Equipment(rw,3n1) Choice offered to:  Patient  DME Arranged:    DME Agency:     HH Arranged:    HH Agency:     Status of Service:  In process, will continue to follow  If discussed at Long Length of Stay Meetings, dates discussed:    Additional Comments:  Traci Wilson, Traci Lajara, RN 07/26/2018, 4:18 PM

## 2018-07-26 NOTE — Care Management Important Message (Signed)
Important Message  Patient Details  Name: Neila Gearvelyn M Hass MRN: [id number] Date of Birth: 06/19/1936   Medicare Important Message Given:       [name], Coulson Wehner 07/26/2018, 11:58 AMImportant Message  Patient Details  Name: Neila Gearvelyn M Wildes MRN: [id number] Date of Birth: 09/11/1935   Medicare Important Message Given:       [name], Keven Osborn 07/26/2018, 11:58 AM

## 2018-07-26 NOTE — Progress Notes (Signed)
PT Cancellation Note  Patient Details Name: Traci Wilson MRN: [id number] DOB: 08/05/1936   Cancelled Treatment:    Reason Eval/Treat Not Completed: Other (comment)Patient reports having ambulated already down both halls. Patient has DME, recommend HHPT.   Rada HayHill, Traci Wilson 07/26/2018, 11:35 AM Blanchard KelchKaren Berdie Wilson PT Acute Rehabilitation Services Pager [phone] Office [phone]

## 2018-07-27 NOTE — Progress Notes (Signed)
Urology Inpatient Progress Report  RIGHT NON FUNCTIONAL KIDNEY  Procedure(s): RIGHT HAND ASSISTED LAPAROSCOPIC NEPHRECTOMY  4 Days Post-Op   Intv/Subj: No acute events overnight. Having some abdominal pain.  States that her daughter-in-law is coming home tomorrow and will be able to help her out at home with ambulation, eating, etc.  Active Problems:   Atrophic kidney  Current Facility-Administered Medications  Medication Dose Route Frequency Provider Last Rate Last Dose  . acetaminophen (TYLENOL) tablet 1,000 mg  1,000 mg Oral Q6H Elyn AquasGessner, Kathryn H, MD   1,000 mg at 07/27/18 0005  . brimonidine (ALPHAGAN) 0.2 % ophthalmic solution 1 drop  1 drop Both Eyes BID Elyn AquasGessner, Kathryn H, MD   1 drop at 07/27/18 1009  . diazepam (VALIUM) tablet 5 mg  5 mg Oral QHS Ray ChurchBell, Jayjay Littles D III, MD   5 mg at 07/26/18 2155  . docusate sodium (COLACE) capsule 100 mg  100 mg Oral BID Elyn AquasGessner, Kathryn H, MD   100 mg at 07/27/18 1009  . dorzolamide-timolol (COSOPT) 22.3-6.8 MG/ML ophthalmic solution 1 drop  1 drop Both Eyes BID Elyn AquasGessner, Kathryn H, MD   1 drop at 07/27/18 1005  . famotidine (PEPCID) tablet 20 mg  20 mg Oral BID Elyn AquasGessner, Kathryn H, MD   20 mg at 07/27/18 1008  . hydrALAZINE (APRESOLINE) injection 10 mg  10 mg Intravenous Q8H PRN Elyn AquasGessner, Kathryn H, MD      . latanoprost (XALATAN) 0.005 % ophthalmic solution 1 drop  1 drop Both Eyes QHS Elyn AquasGessner, Kathryn H, MD   1 drop at 07/26/18 2156  . levothyroxine (SYNTHROID, LEVOTHROID) tablet 150 mcg  150 mcg Oral Q0600 Elyn AquasGessner, Kathryn H, MD   150 mcg at 07/26/18 0631  . meclizine (ANTIVERT) tablet 12.5 mg  12.5 mg Oral BID Elyn AquasGessner, Kathryn H, MD   12.5 mg at 07/27/18 1009  . morphine 2 MG/ML injection 1-2 mg  1-2 mg Intravenous Q2H PRN Elyn AquasGessner, Kathryn H, MD   2 mg at 07/23/18 1351  . ondansetron (ZOFRAN) injection 4 mg  4 mg Intravenous Q4H PRN Elyn AquasGessner, Kathryn H, MD      . rivaroxaban Carlena Hurl(XARELTO) tablet 20 mg  20 mg Oral Q supper Elyn AquasGessner, Kathryn H, MD    20 mg at 07/26/18 1716  . senna (SENOKOT) tablet 8.6 mg  1 tablet Oral BID Elyn AquasGessner, Kathryn H, MD   8.6 mg at 07/27/18 1009  . traMADol (ULTRAM) tablet 50 mg  50 mg Oral Q6H PRN Elyn AquasGessner, Kathryn H, MD   50 mg at 07/27/18 1008   Or  . traMADol (ULTRAM) tablet 100 mg  100 mg Oral Q6H PRN Elyn AquasGessner, Kathryn H, MD   100 mg at 07/25/18 1231     Objective: Vital: Vitals:   07/26/18 0440 07/26/18 1234 07/26/18 2201 07/27/18 0545  BP: 127/65 (!) 129/54 (!) 152/62 121/62  Pulse: 60 63 76 64  Resp: 16 16 16 16   Temp: 97.8 F (36.6 C) 98.8 F (37.1 C) 99.1 F (37.3 C) 98.3 F (36.8 C)  TempSrc: Oral Oral Oral Oral  SpO2: 96% 98% 98% 91%  Weight:      Height:       I/Os: I/O last 3 completed shifts: In: 1075.1 [P.O.:580; I.V.:495.1] Out: 1600 [Urine:1600]  Physical Exam:  General: Patient is in no apparent distress Lungs: Normal respiratory effort, chest expands symmetrically. GI: Incisions are c/d/i.The abdomen is soft and mildlytender without mass. Ext: lower extremities symmetric  Lab Results: No results for input(s): WBC, HGB, HCT  in the last 72 hours. No results for input(s): NA, K, CL, CO2, GLUCOSE, BUN, CREATININE, CALCIUM in the last 72 hours. No results for input(s): LABPT, INR in the last 72 hours. No results for input(s): LABURIN in the last 72 hours. Results for orders placed or performed during the hospital encounter of 03/08/18  Surgical PCR screen     Status: None   Collection Time: 03/11/18  6:11 AM  Result Value Ref Range Status   MRSA, PCR NEGATIVE NEGATIVE Final   Staphylococcus aureus NEGATIVE NEGATIVE Final    Comment: (NOTE) The Xpert SA Assay (FDA approved for NASAL specimens in patients 35 years of age and older), is one component of a comprehensive surveillance program. It is not intended to diagnose infection nor to guide or monitor treatment. Performed at [facility], [address], [place], [place] [address]   Urine Culture      Status: Abnormal   Collection Time: 03/11/18  9:41 AM  Result Value Ref Range Status   Specimen Description   Final    CYSTOSCOPY Performed at [facility], [address], [place], [place] [address]    Special Requests   Final    NONE Performed at [facility], [address] [place], [place] [address]    Culture >=100,000 COLONIES/mL ESCHERICHIA COLI (A)  Final   Report Status 03/14/2018 FINAL  Final   Organism ID, Bacteria ESCHERICHIA COLI (A)  Final      Susceptibility   Escherichia coli - MIC*    AMPICILLIN >=32 RESISTANT Resistant     CEFAZOLIN <=4 SENSITIVE Sensitive     CEFEPIME <=1 SENSITIVE Sensitive     CEFTAZIDIME <=1 SENSITIVE Sensitive     CEFTRIAXONE <=1 SENSITIVE Sensitive     CIPROFLOXACIN >=4 RESISTANT Resistant     GENTAMICIN <=1 SENSITIVE Sensitive     IMIPENEM <=0.25 SENSITIVE Sensitive     TRIMETH/SULFA <=20 SENSITIVE Sensitive     AMPICILLIN/SULBACTAM 16 INTERMEDIATE Intermediate     PIP/TAZO <=4 SENSITIVE Sensitive     Extended ESBL NEGATIVE Sensitive     * >=100,000 COLONIES/mL ESCHERICHIA COLI    Studies/Results: No results found.  Assessment: Right atrophic kidney  Procedure(s): RIGHT HAND ASSISTED LAPAROSCOPIC NEPHRECTOMY, 4 Days Post-Op  doing well.  Plan: General diet, ambulate, continuehome anticoagulation, she will have help at home tomorrow.  I think it would be best to keep her in the hospital forone more day of pain control andambulation and we will plan to discharge her tomorrow since her daughter will be there.   I reassured her that her postoperative pain should improve over time.   Modena Slater, MD Urology 07/27/2018, 11:04 AM

## 2018-07-27 NOTE — Care Management Important Message (Signed)
Important Message  Patient Details  Name: Traci Wilson MRN: [id number] Date of Birth: 03/30/1936   Medicare Important Message Given:  Yes    [name], Atzin Buchta 07/27/2018, 11:02 AMImportant Message  Patient Details  Name: Traci Wilson MRN: [id number] Date of Birth: 02/19/1936   Medicare Important Message Given:  Yes    [name], Emalynn Clewis 07/27/2018, 11:02 AM

## 2018-07-27 NOTE — Progress Notes (Addendum)
Physical Therapy Treatment Patient Details Name: Traci Wilson MRN: [id number] DOB: 11/03/1935 Today's Date: 07/27/2018    History of Present Illness Patient is an 82 y/o female admitted for right hand assisted laparoscopic simple nephrectomy given the minimal function and the fact that she has persistent calculus.   PMH positive for PE, MVP, fibromyalgia, lupus and HTN.     PT Comments    Pt is progressing well; pt states she does not want or need to go to rehab; recommend pt use RW for safety at  Home; pt in agreement; rec HHPT   Follow Up Recommendations  Home health PT;     Equipment Recommendations  None recommended by PT(has cane )    Recommendations for Other Services       Precautions / Restrictions Precautions Precautions: Fall Restrictions Weight Bearing Restrictions: No    Mobility  Bed Mobility Overal bed mobility: Needs Assistance Bed Mobility: Rolling;Sidelying to Sit Rolling: Supervision Sidelying to sit: Supervision       General bed mobility comments: incr time, verbal cues for technique/ log roll  Transfers Overall transfer level: Needs assistance Equipment used: Rolling walker (2 wheeled) Transfers: Sit to/from Stand Sit to Stand: Supervision;Min assist         General transfer comment: incr time, cues for hand placement  Ambulation/Gait Ambulation/Gait assistance: Min guard;Supervision Gait Distance (Feet): 80 Feet Assistive device: Rolling walker (2 wheeled);None;1 person hand held assist Gait Pattern/deviations: Step-to pattern;Step-through pattern;Decreased stride length     General Gait Details: cues for incr trunk extension; min to min/guard for 15' without RW--supervision with RW   Stairs             Wheelchair Mobility    Modified Rankin (Stroke Patients Only)       Balance                                            Cognition Arousal/Alertness: Awake/alert Behavior During Therapy: WFL for  tasks assessed/performed Overall Cognitive Status: Within Functional Limits for tasks assessed                                        Exercises      General Comments        Pertinent Vitals/Pain Pain Assessment: 0-10 Pain Score: 5  Pain Location: in abdomen at surgical site and bilateral groin area Pain Descriptors / Indicators: Tender;Discomfort;Sharp Pain Intervention(s): Limited activity within patient's tolerance;Monitored during session;Premedicated before session;Repositioned    Home Living                      Prior Function            PT Goals (current goals can now be found in the care plan section) Acute Rehab PT Goals Patient Stated Goal: to go home  PT Goal Formulation: With patient Time For Goal Achievement: 08/08/18 Potential to Achieve Goals: Good Progress towards PT goals: Progressing toward goals    Frequency    Min 3X/week      PT Plan Current plan remains appropriate    Co-evaluation              AM-PAC PT "6 Clicks" Mobility   Outcome Measure  Help needed turning from your back to your side  while in a flat bed without using bedrails?: A Little Help needed moving from lying on your back to sitting on the side of a flat bed without using bedrails?: A Little Help needed moving to and from a bed to a chair (including a wheelchair)?: A Little Help needed standing up from a chair using your arms (e.g., wheelchair or bedside chair)?: A Little Help needed to walk in hospital room?: A Little Help needed climbing 3-5 steps with a railing? : A Little 6 Click Score: 18    End of Session Equipment Utilized During Treatment: Gait belt Activity Tolerance: Patient tolerated treatment well Patient left: in chair;with call bell/phone within reach   PT Visit Diagnosis: Other abnormalities of gait and mobility (R26.89);Muscle weakness (generalized) (M62.81)     Time: 1205-1229 PT Time Calculation (min) (ACUTE ONLY): 24  min  Charges:  $Gait Training: 8-22 mins $Therapeutic Activity: 8-22 mins                     Drucilla Chalet, PT  Pager: [phone] Acute Rehab Dept [facility]): [phone]   07/27/2018    [facility] 07/27/2018, 4:08 PM

## 2018-07-28 NOTE — Progress Notes (Signed)
CSW consulted for SNF placement. Per notes, patient will d/c home and PT has recommended home health.  No apparent social work needs. Please re-consult if need arises.  Enid CutterLindsey Bracen Schum, MSW, LCSWA Clinical Social Work [phone]

## 2018-07-28 NOTE — Care Management Note (Signed)
Case Management Note  Patient Details  Name: Neila Gearvelyn M Cedeno MRN: [id number] Date of Birth: 02/01/1936  Subjective/Objective:  Right Hand Assisted Laparoscopic Nephrectomy                  Action/Plan: NCM spoke to pt at bedside. States she had [facility] prior to hospital stay. She has RW and bedside commode at home. States her dtr in law will assist her at home as needed. [facility] aware of scheduled dc home.   Expected Discharge Date:  07/28/18               Expected Discharge Plan:  Home w Home Health Services  In-House Referral:  NA  Discharge planning Services  CM Consult  Post Acute Care Choice:  Home Health, Resumption of Svcs/PTA Provider(rw,3n1) Choice offered to:  Patient  DME Arranged:  N/A DME Agency:  NA  HH Arranged:  PT HH Agency:  [facility]  Status of Service:  Completed, signed off  If discussed at Long Length of Stay Meetings, dates discussed:    Additional Comments:  Elliot CousinShavis, Tywanda Rice Ellen, RN 07/28/2018, 4:50 PM

## 2018-07-28 NOTE — Progress Notes (Signed)
Pt IV not flushing, no blood return noted. IV cath removed, no complications, pt refusing new IV at this time.

## 2018-07-28 NOTE — Progress Notes (Addendum)
Urology Inpatient Progress Report  RIGHT NON FUNCTIONAL KIDNEY  Procedure(s): RIGHT HAND ASSISTED LAPAROSCOPIC NEPHRECTOMY  5 Days Post-Op   Intv/Subj: No acute events overnight. Lost IV access last night, refused to have a new one restarted. Eating this AM, complaining of some abdominal pain Unclear if she has a ride home today.  Active Problems:   Atrophic kidney  Current Facility-Administered Medications  Medication Dose Route Frequency Provider Last Rate Last Dose  . acetaminophen (TYLENOL) tablet 1,000 mg  1,000 mg Oral Q6H Elyn Aquas, MD   1,000 mg at 07/28/18 0559  . brimonidine (ALPHAGAN) 0.2 % ophthalmic solution 1 drop  1 drop Both Eyes BID Elyn Aquas, MD   1 drop at 07/28/18 0915  . diazepam (VALIUM) tablet 5 mg  5 mg Oral QHS Ray Church III, MD   5 mg at 07/27/18 2306  . docusate sodium (COLACE) capsule 100 mg  100 mg Oral BID Elyn Aquas, MD   100 mg at 07/28/18 0914  . dorzolamide-timolol (COSOPT) 22.3-6.8 MG/ML ophthalmic solution 1 drop  1 drop Both Eyes BID Elyn Aquas, MD   1 drop at 07/28/18 0915  . famotidine (PEPCID) tablet 20 mg  20 mg Oral BID Elyn Aquas, MD   20 mg at 07/28/18 0914  . hydrALAZINE (APRESOLINE) injection 10 mg  10 mg Intravenous Q8H PRN Elyn Aquas, MD      . latanoprost (XALATAN) 0.005 % ophthalmic solution 1 drop  1 drop Both Eyes QHS Elyn Aquas, MD   1 drop at 07/27/18 2320  . levothyroxine (SYNTHROID, LEVOTHROID) tablet 150 mcg  150 mcg Oral Q0600 Elyn Aquas, MD   150 mcg at 07/28/18 0558  . meclizine (ANTIVERT) tablet 12.5 mg  12.5 mg Oral BID Elyn Aquas, MD   12.5 mg at 07/28/18 0914  . morphine 2 MG/ML injection 1-2 mg  1-2 mg Intravenous Q2H PRN Elyn Aquas, MD   2 mg at 07/23/18 1351  . ondansetron (ZOFRAN) injection 4 mg  4 mg Intravenous Q4H PRN Elyn Aquas, MD      . rivaroxaban Carlena Hurl) tablet 20 mg  20 mg Oral Q supper Elyn Aquas, MD    20 mg at 07/27/18 1723  . senna (SENOKOT) tablet 8.6 mg  1 tablet Oral BID Elyn Aquas, MD   8.6 mg at 07/28/18 1610  . traMADol (ULTRAM) tablet 50 mg  50 mg Oral Q6H PRN Elyn Aquas, MD   50 mg at 07/28/18 0559   Or  . traMADol (ULTRAM) tablet 100 mg  100 mg Oral Q6H PRN Elyn Aquas, MD   100 mg at 07/27/18 2304     Objective: Vital: Vitals:   07/27/18 1310 07/27/18 2123 07/28/18 0522 07/28/18 0849  BP: 124/63 (!) 151/63 118/61 (!) 141/75  Pulse: 66 63 67 67  Resp: 18 18 20    Temp: 98.2 F (36.8 C) 97.9 F (36.6 C) 98.1 F (36.7 C) 98 F (36.7 C)  TempSrc: Oral Oral Oral Oral  SpO2: 94% 98% 98%   Weight:      Height:       I/Os: I/O last 3 completed shifts: In: 360 [P.O.:360] Out: 850 [Urine:850]  Physical Exam:  General: Patient is in no apparent distress Lungs: Normal respiratory effort, chest expands symmetrically. GI: Incisions are c/d/i.The abdomen is soft and mildlytender without mass. Ext: lower extremities symmetric  Lab Results: No results for input(s): WBC, HGB, HCT  in the last 72 hours. No results for input(s): NA, K, CL, CO2, GLUCOSE, BUN, CREATININE, CALCIUM in the last 72 hours. No results for input(s): LABPT, INR in the last 72 hours. No results for input(s): LABURIN in the last 72 hours. Results for orders placed or performed during the hospital encounter of 03/08/18  Surgical PCR screen     Status: None   Collection Time: 03/11/18  6:11 AM  Result Value Ref Range Status   MRSA, PCR NEGATIVE NEGATIVE Final   Staphylococcus aureus NEGATIVE NEGATIVE Final    Comment: (NOTE) The Xpert SA Assay (FDA approved for NASAL specimens in patients 422 years of age and older), is one component of a comprehensive surveillance program. It is not intended to diagnose infection nor to guide or monitor treatment. Performed at [facility], [address] [place], [place] [address]   Urine Culture     Status: Abnormal    Collection Time: 03/11/18  9:41 AM  Result Value Ref Range Status   Specimen Description   Final    CYSTOSCOPY Performed at [facility], [address] [place], [place] [address]    Special Requests   Final    NONE Performed at [facility], [address] [place], [place] [address]    Culture >=100,000 COLONIES/mL ESCHERICHIA COLI (A)  Final   Report Status 03/14/2018 FINAL  Final   Organism ID, Bacteria ESCHERICHIA COLI (A)  Final      Susceptibility   Escherichia coli - MIC*    AMPICILLIN >=32 RESISTANT Resistant     CEFAZOLIN <=4 SENSITIVE Sensitive     CEFEPIME <=1 SENSITIVE Sensitive     CEFTAZIDIME <=1 SENSITIVE Sensitive     CEFTRIAXONE <=1 SENSITIVE Sensitive     CIPROFLOXACIN >=4 RESISTANT Resistant     GENTAMICIN <=1 SENSITIVE Sensitive     IMIPENEM <=0.25 SENSITIVE Sensitive     TRIMETH/SULFA <=20 SENSITIVE Sensitive     AMPICILLIN/SULBACTAM 16 INTERMEDIATE Intermediate     PIP/TAZO <=4 SENSITIVE Sensitive     Extended ESBL NEGATIVE Sensitive     * >=100,000 COLONIES/mL ESCHERICHIA COLI    Studies/Results: No results found.  Assessment: Right atrophic kidney  Procedure(s): RIGHT HAND ASSISTED LAPAROSCOPIC NEPHRECTOMY, 5 Days Post-Op  doing well.  Plan: Plan to prepare her for discharge today if she can arrange a ride home. Otherwise she'll go home tomorrow morning. Routine post-op care, home meds, including Xarelto, oral pain medications, regular diet.  Adddendum: Patient unable to secure a ride today, feels to tender to go home.  Will plan for discharge early tomorrow.   Urology 07/28/2018, 9:22 AM

## 2018-07-29 NOTE — [place] FL2 (Signed)
[place] MEDICAID FL2 LEVEL OF CARE SCREENING TOOL     IDENTIFICATION  Patient Name: Traci Wilson Birthdate: 18-Dec-1935 Sex: female Admission Date (Current Location): 07/23/2018  [facility] and [place] Number:  Producer, television/film/video and Address:  [facility],  [address] [place], [place] [address]      Provider Number: [id number]  Attending Physician Name and Address:  Crista Elliot, MD  Relative Name and Phone Number:  Alekhya Gravlin: [phone]    Current Level of Care: Hospital Recommended Level of Care: Skilled Nursing Facility Prior Approval Number:    Date Approved/Denied:   PASRR Number: [id number]  Discharge Plan: SNF    Current Diagnoses: Patient Active Problem List   Diagnosis Date Noted  . Atrophic kidney 07/23/2018  . Hematuria 04/16/2018  . Abdominal pain, acute, right lower quadrant 03/08/2018  . Dyspnea 05/29/2012  . Hypothyroid 05/29/2012  . Cough 03/06/2012    Orientation RESPIRATION BLADDER Height & Weight     Self, Situation, Place, Time  Normal Continent Weight: 216 lb (98 kg) Height:  5\' 5"  (165.1 cm)  BEHAVIORAL SYMPTOMS/MOOD NEUROLOGICAL BOWEL NUTRITION STATUS      Continent Diet(Regular)  AMBULATORY STATUS COMMUNICATION OF NEEDS Skin   Limited Assist Verbally Surgical wounds(Abdomen incision)                       Personal Care Assistance Level of Assistance  Bathing, Feeding, Dressing Bathing Assistance: Limited assistance Feeding assistance: Independent Dressing Assistance: Limited assistance     Functional Limitations Info  Sight, Hearing, Speech Sight Info: Adequate Hearing Info: Adequate Speech Info: Adequate    SPECIAL CARE FACTORS FREQUENCY  PT (By licensed PT), OT (By licensed OT)     PT Frequency: 5x/week OT Frequency: 5x/week            Contractures Contractures Info: Not present    Additional Factors Info  Code Status, Allergies Code Status Info: Full Allergies Info: CODEINE             Current Medications (07/29/2018):  This is the current hospital active medication list Current Facility-Administered Medications  Medication Dose Route Frequency Provider Last Rate Last Dose  . acetaminophen (TYLENOL) tablet 1,000 mg  1,000 mg Oral Q6H Elyn Aquas, MD   1,000 mg at 07/29/18 1251  . brimonidine (ALPHAGAN) 0.2 % ophthalmic solution 1 drop  1 drop Both Eyes BID Elyn Aquas, MD   1 drop at 07/29/18 0910  . diazepam (VALIUM) tablet 5 mg  5 mg Oral QHS Ray Church III, MD   5 mg at 07/28/18 2147  . docusate sodium (COLACE) capsule 100 mg  100 mg Oral BID Elyn Aquas, MD   100 mg at 07/29/18 8469  . dorzolamide-timolol (COSOPT) 22.3-6.8 MG/ML ophthalmic solution 1 drop  1 drop Both Eyes BID Elyn Aquas, MD   1 drop at 07/29/18 0910  . famotidine (PEPCID) tablet 20 mg  20 mg Oral BID Elyn Aquas, MD   20 mg at 07/29/18 0909  . hydrALAZINE (APRESOLINE) injection 10 mg  10 mg Intravenous Q8H PRN Elyn Aquas, MD      . latanoprost (XALATAN) 0.005 % ophthalmic solution 1 drop  1 drop Both Eyes QHS Elyn Aquas, MD   1 drop at 07/28/18 2149  . levothyroxine (SYNTHROID, LEVOTHROID) tablet 150 mcg  150 mcg Oral Q0600 Elyn Aquas, MD   150 mcg at 07/29/18 0536  . meclizine (  ANTIVERT) tablet 12.5 mg  12.5 mg Oral BID Elyn AquasGessner, Kathryn H, MD   12.5 mg at 07/29/18 [id number]  . morphine 2 MG/ML injection 1-2 mg  1-2 mg Intravenous Q2H PRN Elyn AquasGessner, Kathryn H, MD   2 mg at 07/23/18 1351  . ondansetron (ZOFRAN) injection 4 mg  4 mg Intravenous Q4H PRN Elyn AquasGessner, Kathryn H, MD      . rivaroxaban Carlena Hurl(XARELTO) tablet 20 mg  20 mg Oral Q supper Elyn AquasGessner, Kathryn H, MD   20 mg at 07/28/18 1707  . senna (SENOKOT) tablet 8.6 mg  1 tablet Oral BID Elyn AquasGessner, Kathryn H, MD   8.6 mg at 07/29/18 [id number]  . traMADol (ULTRAM) tablet 50 mg  50 mg Oral Q6H PRN Elyn AquasGessner, Kathryn H, MD   50 mg at 07/29/18 0920   Or  . traMADol Janean Sark(ULTRAM) tablet 100 mg  100 mg Oral Q6H PRN  Elyn AquasGessner, Kathryn H, MD   100 mg at 07/28/18 1240     Discharge Medications: Please see discharge summary for a list of discharge medications.  Relevant Imaging Results:  Relevant Lab Results:   Additional Information SSN: [id number]  Enid CutterLindsey Cordai Rodrigue, [place]

## 2018-07-29 NOTE — Progress Notes (Signed)
Physical Therapy Treatment Patient Details Name: Traci Wilson MRN: [id number] DOB: 02/21/1936 Today's Date: 07/29/2018    History of Present Illness  82 y/o female admitted for right hand assisted laparoscopic simple nephrectomy given the minimal function and the fact that she has persistent calculus.   PMH positive for PE, MVP, fibromyalgia, lupus and HTN.     PT Comments    MD placed order for reassessment for SNF. Pt reported she and her family are interested in SNF placement due to pt not having any help available at home. At this time, pt is Min guard-Min assist for mobility. She reports mild-moderate pain at surgical site with activity. Pt could potentially benefit from a short stay in rehab to maximize mobility independence and safety prior to returning home alone. Will continue to follow.    Follow Up Recommendations  SNF(pt and family have requested SNF placement due to pt being home alone)     Equipment Recommendations  None recommended by PT    Recommendations for Other Services       Precautions / Restrictions Precautions Precautions: Fall Restrictions Weight Bearing Restrictions: No    Mobility  Bed Mobility Overal bed mobility: Needs Assistance Bed Mobility: Supine to Sit;Sit to Supine     Supine to sit: Min guard;HOB elevated Sit to supine: Min assist   General bed mobility comments: Increased time. Assist for LEs back onto bed.   Transfers Overall transfer level: Needs assistance Equipment used: Rolling walker (2 wheeled) Transfers: Sit to/from Stand Sit to Stand: Min guard         General transfer comment: close guard for safety. VCs hand placement  Ambulation/Gait Ambulation/Gait assistance: Min guard Gait Distance (Feet): 75 Feet Assistive device: Rolling walker (2 wheeled) Gait Pattern/deviations: Step-through pattern;Decreased stride length     General Gait Details: Close guard for safety. Slow gait speed. Cues for posture,  safety   Stairs             Wheelchair Mobility    Modified Rankin (Stroke Patients Only)       Balance Overall balance assessment: Needs assistance           Standing balance-Leahy Scale: Fair                              Cognition Arousal/Alertness: Awake/alert Behavior During Therapy: WFL for tasks assessed/performed Overall Cognitive Status: Within Functional Limits for tasks assessed                                        Exercises      General Comments        Pertinent Vitals/Pain Pain Score: 5  Pain Location: in abdomen at surgical site  Pain Descriptors / Indicators: Tender;Discomfort Pain Intervention(s): Limited activity within patient's tolerance;Repositioned    Home Living                      Prior Function            PT Goals (current goals can now be found in the care plan section) Progress towards PT goals: Progressing toward goals    Frequency    Min 3X/week      PT Plan Discharge plan needs to be updated    Co-evaluation  AM-PAC PT "6 Clicks" Mobility   Outcome Measure  Help needed turning from your back to your side while in a flat bed without using bedrails?: A Little Help needed moving from lying on your back to sitting on the side of a flat bed without using bedrails?: A Little Help needed moving to and from a bed to a chair (including a wheelchair)?: A Little Help needed standing up from a chair using your arms (e.g., wheelchair or bedside chair)?: A Little Help needed to walk in hospital room?: A Little Help needed climbing 3-5 steps with a railing? : A Little 6 Click Score: 18    End of Session   Activity Tolerance: Patient tolerated treatment well Patient left: in bed;with call bell/phone within reach;with bed alarm set   PT Visit Diagnosis: Muscle weakness (generalized) (M62.81);Unsteadiness on feet (R26.81)     Time: 1610-9604 PT Time Calculation  (min) (ACUTE ONLY): 18 min  Charges:  $Gait Training: 8-22 mins                        Rebeca Alert, PT Acute Rehabilitation Services Pager: [phone] Office: [phone]

## 2018-07-29 NOTE — Clinical Social Work Note (Addendum)
Clinical Social Work Assessment  Patient Details  Name: Traci Wilson MRN: [id number] Date of Birth: 1936/03/13  Date of referral:  07/29/18               Reason for consult:  Facility Placement                Permission sought to share information with:  Facility Art therapist granted to share information::  Yes, Verbal Permission Granted  Name::     Sueanne Margarita  Agency::  SNF  Relationship::  Daughter  Contact Information:  [phone]  Housing/Transportation Living arrangements for the past 2 months:  Single Family Home Source of Information:  Patient Patient Interpreter Needed:  None Criminal Activity/Legal Involvement Pertinent to Current Situation/Hospitalization:  No - Comment as needed Significant Relationships:  Adult Children Lives with:  Adult Children(Daughter-in-law) Do you feel safe going back to the place where you live?  Yes Need for family participation in patient care:  No (Coment)  Care giving concerns:  Patient admitted for right hand assisted laparoscopic simple nephrectomy given the minimal function and the fact that she has persistent calculus. Patient lives with daughter-in-law who is unable to provide support during the day due to work. PT recommending SNF for short term therapy due to lack of home support.    Social Worker assessment / plan:  CSW met with patient at bedside to discuss plans for discharge and SNF referral process.  Patient is reluctantly agreeable to SNF and states her children decided for her that she needed short term therapy. She states she is only agreeable to one week and had been excited because she was originally discharging home today. Patient lives at home with daughter-in-law who works during the day resulting in patient being alone for 8+ hours. Patient reports she does not do much at home besides watch television, ambulate to make food, and use the bathroom. She states her children are worried about her falling  or hurting herself while at home alone.   Patient is only agreeable to [facility]. Patient's son volunteers there and patient's uncle was a resident there. She did not give permission to send referrals to any other facility.   [employer] insurance Josem Kaufmann will be required before discharging. CSW will complete FL2 and send referral.  Employment status:  Retired Nurse, adult PT Recommendations:  [place] / Referral to community resources:  [place]  Patient/Family's Response to care:  Patient was appropriate and pleasant to speak with. She voiced her frustrations at her children thinking she needs to go to a SNF before returning home.  Patient/Family's Understanding of and Emotional Response to Diagnosis, Current Treatment, and Prognosis:  Patient understands CSW role and SNF process. She has previously visited [facility] and is only agreeable to going there.  Emotional Assessment Appearance:  Appears stated age Attitude/Demeanor/Rapport:    Affect (typically observed):  Pleasant, Appropriate Orientation:  Oriented to Self, Oriented to Place, Oriented to  Time, Oriented to Situation Alcohol / Substance use:  Not Applicable Psych involvement (Current and /or in the community):  No (Comment)  Discharge Needs  Concerns to be addressed:  Care Coordination Readmission within the last 30 days:  No Current discharge risk:  Physical Impairment Barriers to Discharge:  [place], [place] 07/29/2018, 3:07 PM

## 2018-07-29 NOTE — Progress Notes (Signed)
CSW Referral for SNF, [facility]idoro DonningAlesia Andrews Tener RN CCM Case Mgmt phone [phone]

## 2018-07-29 NOTE — Progress Notes (Signed)
Pt. Agree's to go to SNF.

## 2018-07-29 NOTE — Progress Notes (Signed)
6 Days Post-Op Subjective: No acute events overnight.  Pain controlled.  Tolerating regular food and passing flatus.  No BM yet.  Voiding without difficulty.  Renal function and blood counts appropriate  The patient states that she has no family to help her at home and would like to go to a rehab facility for approximately 1 week.  Objective: Vital signs in last 24 hours: Temp:  [97.8 F (36.6 C)-98.6 F (37 C)] 98.3 F (36.8 C) (12/08 0542) Pulse Rate:  [58-75] 61 (12/08 0542) Resp:  [16] 16 (12/08 0542) BP: (123-125)/(60-63) 125/60 (12/08 0542) SpO2:  [93 %-98 %] 94 % (12/08 0542)  Intake/Output from previous day: No intake/output data recorded.  Intake/Output this shift: No intake/output data recorded.  Physical Exam:  General: Alert and oriented CV: RRR, palpable distal pulses Lungs: CTAB, equal chest rise Abdomen: Soft, NTND, no rebound or guarding Incisions: Clean, dry and intact Ext: NT, No erythema  Lab Results: No results for input(s): HGB, HCT in the last 72 hours. BMET No results for input(s): NA, K, CL, CO2, GLUCOSE, BUN, CREATININE, CALCIUM in the last 72 hours.   Studies/Results: No results found.  Assessment/Plan: Postop day 6 status post hand-assisted right nephrectomy.  History of atrophic right kidney  -Out of bed to chair and ambulate -Advance diet -Plan for discharge to rehab facility on Monday.  PT reconsulted for assessment.  Case management following   LOS: 6 days   Rhoderick Moodyhristopher Alfa Leibensperger, MD [facility] Pager: [phone]  07/29/2018, 10:08 AM

## 2018-07-30 NOTE — Progress Notes (Signed)
Clinical Social Worker following  patient for support and discharge needs. Patient agreeable to go to rehab and [facility]. CSW started authorization through [facility] and is awaiting there decision.   Marrianne MoodAshley Jag Lenz, MSW,  Theresia MajorsLCSWA [phone]

## 2018-07-30 NOTE — Evaluation (Signed)
Occupational Therapy Evaluation Patient Details Name: Traci Wilson MRN: [id number] DOB: 10/19/1935 Today's Date: 07/30/2018    History of Present Illness  82 y/o female admitted for right hand assisted laparoscopic simple nephrectomy given the minimal function and the fact that she has persistent calculus.   PMH positive for PE, MVP, fibromyalgia, lupus and HTN.    Clinical Impression   Pt admitted for atrophic kidney.   Pt currently with functional limitations due to the deficits listed below (see OT Problem List).  Pt will benefit from skilled OT to increase their safety and independence with ADL and functional mobility for ADL to facilitate discharge to venue listed below. Pt prefers DC home and states son can A. Pt daughter feels she needs STSNF.  From OT eval pt needs 24/7 A and post acute OT to increase I with ADL activity and return to PLOF     Follow Up Recommendations  SNF;Supervision/Assistance - 24 hour(if not SNF- then reccomend HHOT )    Equipment Recommendations  Other (comment)    Recommendations for Other Services       Precautions / Restrictions Precautions Precautions: Fall Restrictions Weight Bearing Restrictions: No      Mobility Bed Mobility Overal bed mobility: Needs Assistance Bed Mobility: Supine to Sit     Supine to sit: Min guard     General bed mobility comments: Increased time. Assist for LEs back onto bed.   Transfers Overall transfer level: Needs assistance Equipment used: Rolling walker (2 wheeled) Transfers: Sit to/from [employer]Stand;Stand Pivot Transfers Sit to Stand: Min guard Stand pivot transfers: Min guard       General transfer comment: close guard for safety. VCs hand placement    Balance Overall balance assessment: Needs assistance           Standing balance-Leahy Scale: Fair                             ADL either performed or assessed with clinical judgement   ADL Overall ADL's : Needs  assistance/impaired Eating/Feeding: Set up;Sitting   Grooming: Minimal assistance;Standing   Upper Body Bathing: Minimal assistance;Sitting   Lower Body Bathing: Minimal assistance;Sit to/from stand;Cueing for sequencing;Cueing for safety   Upper Body Dressing : Set up;Sitting   Lower Body Dressing: Minimal assistance;Sit to/from stand;Cueing for sequencing;Cueing for safety   Toilet Transfer: Minimal assistance;Cueing for sequencing;RW;Cueing for safety   Toileting- Clothing Manipulation and Hygiene: Minimal assistance;Sit to/from stand;Cueing for safety         General ADL Comments: Pt prefers to go home with son to A.  Pts daugtherwants to her to go to SNF.  Pt needed post acute rehab as well as 24/7.     Vision Baseline Vision/History: Wears glasses Patient Visual Report: No change from baseline              Pertinent Vitals/Pain Pain Score: 3  Pain Location: in abdomen at surgical site  Pain Descriptors / Indicators: Discomfort Pain Intervention(s): Limited activity within patient's tolerance;Repositioned;Monitored during session        Extremity/Trunk Assessment             Communication Communication Communication: No difficulties   Cognition Arousal/Alertness: Awake/alert Behavior During Therapy: WFL for tasks assessed/performed Overall Cognitive Status: Within Functional Limits for tasks assessed  Home Living Family/patient expects to be discharged to:: Private residence Living Arrangements: Children Available Help at Discharge: Family;Available PRN/intermittently(daughter in law works but will be available on weekends, daughter can also help some) Type of Home: House       Home Layout: One level               Home Equipment: Walker - 2 wheels          Prior Functioning/Environment Level of Independence: Independent                 OT Problem List: Decreased  strength;Decreased activity tolerance;Impaired balance (sitting and/or standing);Decreased knowledge of use of DME or AE;Decreased safety awareness      OT Treatment/Interventions: Self-care/ADL training;Patient/family education;DME and/or AE instruction;Therapeutic activities    OT Goals(Current goals can be found in the care plan section) Acute Rehab OT Goals Patient Stated Goal: to go home  OT Goal Formulation: With patient Time For Goal Achievement: 08/13/18  OT Frequency: Min 2X/week   Barriers to D/C:            Co-evaluation              AM-PAC OT "6 Clicks" Daily Activity     Outcome Measure Help from another person eating meals?: None Help from another person taking care of personal grooming?: A Little Help from another person toileting, which includes using toliet, bedpan, or urinal?: A Little Help from another person bathing (including washing, rinsing, drying)?: A Little Help from another person to put on and taking off regular upper body clothing?: A Little Help from another person to put on and taking off regular lower body clothing?: A Little 6 Click Score: 19   End of Session Equipment Utilized During Treatment: Rolling walker Nurse Communication: Mobility status  Activity Tolerance: Patient tolerated treatment well Patient left: in chair;with call bell/phone within reach;Other (comment)(with SW present)  OT Visit Diagnosis: Unsteadiness on feet (R26.81);Muscle weakness (generalized) (M62.81);History of falling (Z91.81)                Time: 4098-1191 OT Time Calculation (min): 19 min Charges:  OT General Charges $OT Visit: 1 Visit OT Evaluation $OT Eval Moderate Complexity: 1 Mod  Lise Auer, OT Acute Rehabilitation Services [phone] Office- [phone]     Livio Ledwith, Karin Golden D 07/30/2018, 3:41 PM

## 2018-07-30 NOTE — Progress Notes (Addendum)
Clinical Social Worker facilitated patient discharge including contacting patient family and facility to confirm patient discharge plans.  Clinical information faxed to facility and family agreeable with plan.  Per patient her [facility] pick her [facility]  RN to call [phone] (pt will go in rm# [id number]) for report prior to discharge.  Clinical Social Worker will sign off for now as social work intervention is no longer needed. Please consult us again if new need arises.  Marrianne MoodAshley Jeannemarie Sawaya, MSW, [employer]CSWA [phone]

## 2018-07-30 NOTE — Care Management Important Message (Signed)
Important Message  Patient Details  Name: Traci Wilson MRN: [id number] Date of Birth: 06/08/1936   Medicare Important Message Given:  Yes    [name], [name] 07/30/2018, 2:14 PMImportant Message  Patient Details  Name: Traci Wilson MRN: [id number] Date of Birth: 02/26/1936   Medicare Important Message Given:  Yes    [name], [name] 07/30/2018, 2:14 PM

## 2018-09-25 DIAGNOSIS — H401133 Primary open-angle glaucoma, bilateral, severe stage: Secondary | ICD-10-CM | POA: Insufficient documentation

## 2018-09-25 DIAGNOSIS — H3561 Retinal hemorrhage, right eye: Secondary | ICD-10-CM | POA: Insufficient documentation

## 2018-09-25 DIAGNOSIS — Z961 Presence of intraocular lens: Secondary | ICD-10-CM | POA: Insufficient documentation

## 2019-01-01 DIAGNOSIS — Z9889 Other specified postprocedural states: Secondary | ICD-10-CM | POA: Insufficient documentation

## 2020-06-20 DIAGNOSIS — L930 Discoid lupus erythematosus: Secondary | ICD-10-CM | POA: Insufficient documentation

## 2020-06-20 DIAGNOSIS — M797 Fibromyalgia: Secondary | ICD-10-CM | POA: Insufficient documentation

## 2020-08-13 IMAGING — NM NM RENAL IMAGING FLOW W/ PHARM
2 series · 12 of 12 positions shown · non-contrast
Comparison: CT AP 03/08/2018

CLINICAL DATA: Right-sided abdominal pain. Right kidney stones and
hydronephrosis.

EXAM:
NUCLEAR MEDICINE RENAL SCAN WITH DIURETIC ADMINISTRATION
TECHNIQUE: Radionuclide angiographic and sequential renal images were obtained
after intravenous injection of radiopharmaceutical. Imaging was
continued during slow intravenous injection of Lasix approximately
15 minutes after the start of the examination.
RADIOPHARMACEUTICALS:  4.8 mCi 6echnetium-AAm MAG3 IV

[Series 1: renal scan · 4.14mm/px · 6 of 90 frames shown (1 of 2)]
[frame 8/90]
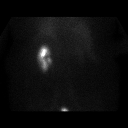
[frame 23/90]
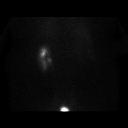
[frame 38/90]
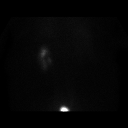
[frame 53/90]
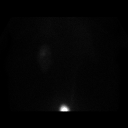
[frame 68/90]
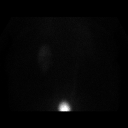
[frame 83/90]
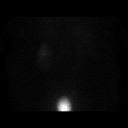

[Series 1: renal scan · 4.14mm/px · 6 of 40 frames shown (2 of 2)]
[frame 4/40  full-range]
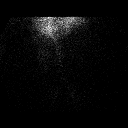
[frame 10/40  full-range]
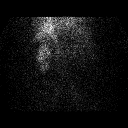
[frame 17/40  full-range]
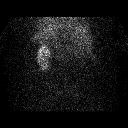
[frame 24/40  full-range]
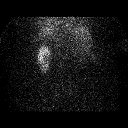
[frame 30/40  full-range]
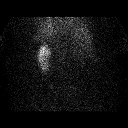
[frame 37/40  full-range]
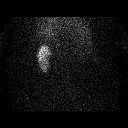

[12 of 12 positions shown; findings below may reference images not displayed]

FINDINGS: Flow: Normal perfusion to the left kidney. Markedly diminished
profusion to the right kidney..

Left renogram: There is normal cortical uptake, excretion, and
clearance of the radiopharmaceutical by the left kidney.

Right renogram: Markedly diminished radiotracer uptake by the right
kidney. No significant excretion or clearance of the
radiopharmaceutical body right kidney noted..

Differential:

Left kidney = 90 %

Right kidney = 10 %
IMPRESSION: 1. Markedly diminished right renal function. Insufficient
radiotracer excretion by the right kidney to assess clearance.
2. Relative normally functioning left kidney.
3. Split renal function is equal to 90% from the left kidney and 10%
from the right kidney.

## 2020-11-18 DIAGNOSIS — I10 Essential (primary) hypertension: Secondary | ICD-10-CM | POA: Insufficient documentation

## 2020-11-18 DIAGNOSIS — I82409 Acute embolism and thrombosis of unspecified deep veins of unspecified lower extremity: Secondary | ICD-10-CM | POA: Insufficient documentation

## 2020-12-05 DIAGNOSIS — M546 Pain in thoracic spine: Secondary | ICD-10-CM | POA: Insufficient documentation

## 2020-12-30 DIAGNOSIS — E559 Vitamin D deficiency, unspecified: Secondary | ICD-10-CM | POA: Insufficient documentation

## 2021-12-15 DIAGNOSIS — H26493 Other secondary cataract, bilateral: Secondary | ICD-10-CM | POA: Insufficient documentation

## 2021-12-15 DIAGNOSIS — H5461 Unqualified visual loss, right eye, normal vision left eye: Secondary | ICD-10-CM | POA: Insufficient documentation

## 2021-12-15 DIAGNOSIS — H35373 Puckering of macula, bilateral: Secondary | ICD-10-CM | POA: Insufficient documentation

## 2022-01-25 ENCOUNTER — Encounter: Payer: Self-pay | Admitting: Internal Medicine

## 2022-01-25 ENCOUNTER — Ambulatory Visit (INDEPENDENT_AMBULATORY_CARE_PROVIDER_SITE_OTHER): Payer: Medicare Other | Admitting: Internal Medicine

## 2022-01-25 ENCOUNTER — Ambulatory Visit (INDEPENDENT_AMBULATORY_CARE_PROVIDER_SITE_OTHER): Payer: Medicare Other

## 2022-01-25 VITALS — BP 142/83 | HR 78 | Resp 13 | Ht 65.0 in | Wt 231.2 lb

## 2022-01-25 DIAGNOSIS — M329 Systemic lupus erythematosus, unspecified: Secondary | ICD-10-CM | POA: Diagnosis not present

## 2022-01-25 DIAGNOSIS — G8929 Other chronic pain: Secondary | ICD-10-CM | POA: Diagnosis not present

## 2022-01-25 DIAGNOSIS — R700 Elevated erythrocyte sedimentation rate: Secondary | ICD-10-CM | POA: Diagnosis not present

## 2022-01-25 DIAGNOSIS — M797 Fibromyalgia: Secondary | ICD-10-CM

## 2022-01-25 DIAGNOSIS — M546 Pain in thoracic spine: Secondary | ICD-10-CM

## 2022-01-25 DIAGNOSIS — M545 Low back pain, unspecified: Secondary | ICD-10-CM

## 2022-01-25 DIAGNOSIS — E119 Type 2 diabetes mellitus without complications: Secondary | ICD-10-CM | POA: Insufficient documentation

## 2022-01-25 NOTE — Progress Notes (Signed)
Office Visit Note  Patient: Traci Wilson             Date of Birth: [birth year]           MRN: [id number]             PCP: Shanon Rosser, PA-C Referring: Shanon Rosser, PA-C Visit Date: 01/25/2022  Subjective:   History of Present Illness: Traci Wilson is a 86 y.o. female here for evaluation with concern for giant cell arteritis on account of deterioration in vision and elevated serum inflammatory markers. She has a long medical history including previous lupus many decades ago but has been in remission for many years. She was never on longstanding DMARD medications. Current concern is due to progression of right eye visual deficits despite apparently good IOP control for her glaucoma. She did not present with any typical associated symptoms but inflammatory markers checked showed moderate elevation in ESR and CRP. She reports being prescribed prednisone then told to stop taking this after 3 days. She was also recommended to see a retina specialist and referred for this by her ophthalmologist Dr. Ander Slade. She has fibromyalgia syndrome with chronic pain in multiple areas. She also has some chronic back pain at multiple levels, with some right sided back pain and flank pain increased after some falls last year. She saw Dr. Nelva Bush for chronic back pain previously but has not seen him in a few years.  Labs reviewed 11/2021 ESR 41 CRP 22.6   Review of Systems  Constitutional:  Negative for weight loss.  Eyes:  Positive for visual disturbance. Negative for photophobia and pain.  Respiratory:  Negative for shortness of breath.   Cardiovascular:  Negative for chest pain.  Musculoskeletal:  Positive for joint pain, joint pain and muscle tenderness.  Neurological:  Negative for headaches and weakness.    PMFS History:  Patient Active Problem List   Diagnosis Date Noted   Type 2 diabetes mellitus ([place]) 01/25/2022   Low back pain 01/25/2022   Sedimentation rate elevation 01/25/2022   Epiretinal  membrane (ERM) of both eyes 12/15/2021   Decreased vision of right eye 12/15/2021   Posterior capsular opacification of both eyes, obscuring vision 12/15/2021   Vitamin D deficiency 12/30/2020   Thoracic back pain 12/05/2020   Hypertensive disorder 11/18/2020   Deep venous thrombosis ([place]) 11/18/2020   Lupus erythematosus 06/20/2020   Fibromyalgia 06/20/2020   Postsurgical states following surgery of eye and adnexa 01/01/2019   Primary open angle glaucoma (POAG) of both eyes, severe stage 09/25/2018   Pseudophakia of both eyes 09/25/2018   Subretinal hemorrhage of right eye 09/25/2018   Atrophic kidney 07/23/2018   Hematuria 04/16/2018   Abdominal pain, acute, right lower quadrant 03/08/2018   Increased frequency of urination 12/03/2014   Nocturia 12/03/2014   Rectocele 12/03/2014   Slow transit constipation 12/03/2014   Dyspnea 05/29/2012   Hypothyroid 05/29/2012   Cough 03/06/2012    Past Medical History:  Diagnosis Date   Anemia    Asthma    hx of   Dysrhythmia    beats fast at times   Fibromyalgia    Glaucoma    History of kidney stones    Hypertension    Hypothyroidism    Lupus (HCC)    MVP (mitral valve prolapse)    Pneumonia    Pulmonary embolism ([place])    Vertigo 2017    Family History  Problem Relation Age of Onset   Heart disease Mother  Heart disease Father    Heart disease Brother    Past Surgical History:  Procedure Laterality Date   ABDOMINAL HYSTERECTOMY     APPENDECTOMY     BUNIONECTOMY     bil   CATARACT EXTRACTION, BILATERAL     CYSTOSCOPY WITH STENT PLACEMENT Right 03/11/2018   Procedure: CYSTOSCOPY WITH RIGHT STENT PLACEMENT;  Surgeon: Lucas Mallow, MD;  Location: [facility];  Service: Urology;  Laterality: Right;   CYSTOSCOPY/RETROGRADE/URETEROSCOPY     Dr. Gloriann Loan 04-16-18    CYSTOSCOPY/URETEROSCOPY/HOLMIUM LASER/STENT PLACEMENT Right 04/16/2018   Procedure: CYSTOSCOPY RIGHT RETROGRADE PYELOGRAM RIGHT URETEROSCOPY HOLMIUM LASER RIGHT  URETER BIOPSY STENT PLACEMENT;  Surgeon: Lucas Mallow, MD;  Location: [facility];  Service: Urology;  Laterality: Right;   EYE SURGERY     bil   GLAUCOMA SURGERY     LAPAROSCOPIC NEPHRECTOMY, HAND ASSISTED Right 07/23/2018   Procedure: RIGHT HAND ASSISTED LAPAROSCOPIC NEPHRECTOMY;  Surgeon: Lucas Mallow, MD;  Location: [facility];  Service: Urology;  Laterality: Right;   ROTATOR CUFF REPAIR     right   Social History   Social History Narrative   Not on file   Immunization History  Administered Date(s) Administered   PPD Test 02/25/2014     Objective: Vital Signs: BP (!) 142/83 (BP Location: Right Arm, Patient Position: Sitting, Cuff Size: Large)   Pulse 78   Resp 13   Ht _0  (1.651 m)   Wt 231 lb 3.2 oz (104.9 kg)   BMI 38.47 kg/m    Physical Exam Constitutional:      Appearance: She is obese.  Cardiovascular:     Rate and Rhythm: Normal rate and regular rhythm.  Pulmonary:     Effort: Pulmonary effort is normal.     Breath sounds: Normal breath sounds.  Skin:    General: Skin is warm and dry.  Neurological:     Mental Status: She is alert.      Musculoskeletal Exam:  Mild TMJ pain with fully opening and closing and direct pressure, no crepitus, no palpable swelling Shoulders full ROM no tenderness or swelling Elbows full ROM no tenderness or swelling Wrists full ROM no tenderness or swelling Fingers full ROM no tenderness or swelling Midline and bilateral paraspinal tenderness to pressure over lower thoracic and lumbar spine Knees full ROM no tenderness or swelling   Investigation: No additional findings.  Imaging: No results found.  Recent Labs: Lab Results  Component Value Date   WBC 10.0 07/24/2018   HGB 11.0 (L) 07/24/2018   PLT 238 07/24/2018   NA 135 07/24/2018   K 4.2 07/24/2018   CL 106 07/24/2018   CO2 22 07/24/2018   GLUCOSE 170 (H) 07/24/2018   BUN 22 07/24/2018   CREATININE 1.36 (H) 07/24/2018   BILITOT 0.4 03/14/2018    ALKPHOS 70 03/14/2018   AST 25 03/14/2018   ALT 15 03/14/2018   PROT 7.0 03/14/2018   ALBUMIN 2.1 (L) 03/14/2018   CALCIUM 8.8 (L) 07/24/2018   GFRAA 42 (L) 07/24/2018    Speciality Comments: No specialty comments available.  Procedures:  No procedures performed Allergies: Codeine   Assessment / Plan:     Visit Diagnoses: Sedimentation rate elevation - Plan: Sedimentation rate, C-reactive protein  Elevated serum inflammatory markers but symptoms do not appear typical for PMR or GCA concern.  The jaw pain localizes to TMJ area is not suggestive for claudication.  Recommend we repeat the inflammatory markers if these are  improving over time may represent some transient increased inflammation.  Lower suspicion for a active systemic connective tissue disease process.  Fibromyalgia  Extensive history with generalized symptoms and pain in multiple areas.  Currently does localize things mostly problems with the spine.  Not currently complaining of extensive cognitive or functional bowel problems.  Personal history of systemic lupus erythematosus (SLE) (HCC)  Apparent previous history of lupus but I do not see any clinical criteria on examination today.  May have had bowel disease now in remission or cannot confirm original diagnosis.  Do not recommend any DMARD medication start at this time and do not plan to recheck extensive serologic markers.  Chronic bilateral thoracic back pain  Chronic bilateral low back pain without sciatica - Plan: XR Thoracic Spine 2 View, XR Lumbar Spine 2-3 Views  X-ray of the lumbar and thoracic spine shows multilevel degenerative changes and in the thoracic spine especially changes look consistent for DISH.  No evidence of neurologic deficits or entrapment so no procedural intervention recommended at this time.  Orders: Orders Placed This Encounter  Procedures   XR Thoracic Spine 2 View   XR Lumbar Spine 2-3 Views   Sedimentation rate   C-reactive protein    No orders of the defined types were placed in this encounter.   Follow-Up Instructions: No follow-ups on file.   Collier Salina, MD  Note - This record has been created using [employer].  Chart creation errors have been sought, but may not always  have been located. Such creation errors do not reflect on  the standard of medical care.

## 2022-01-26 LAB — SEDIMENTATION RATE: Sed Rate: 38 mm/h — ABNORMAL HIGH (ref 0–30)

## 2022-01-26 LAB — C-REACTIVE PROTEIN: CRP: 20 mg/L — ABNORMAL HIGH (ref <8.0)

## 2022-04-27 ENCOUNTER — Ambulatory Visit: Payer: Medicare Other | Admitting: Internal Medicine

## 2022-08-10 ENCOUNTER — Other Ambulatory Visit: Payer: Self-pay

## 2022-08-10 ENCOUNTER — Encounter (HOSPITAL_BASED_OUTPATIENT_CLINIC_OR_DEPARTMENT_OTHER): Payer: Self-pay | Admitting: Emergency Medicine

## 2022-08-10 ENCOUNTER — Emergency Department (HOSPITAL_BASED_OUTPATIENT_CLINIC_OR_DEPARTMENT_OTHER)
Admission: EM | Admit: 2022-08-10 | Discharge: 2022-08-10 | Disposition: A | Discharge status: Home / Self Care | Payer: Medicare HMO | Attending: Emergency Medicine | Admitting: Emergency Medicine

## 2022-08-10 ENCOUNTER — Emergency Department (HOSPITAL_BASED_OUTPATIENT_CLINIC_OR_DEPARTMENT_OTHER): Payer: Medicare HMO

## 2022-08-10 DIAGNOSIS — I10 Essential (primary) hypertension: Secondary | ICD-10-CM | POA: Insufficient documentation

## 2022-08-10 DIAGNOSIS — Z87891 Personal history of nicotine dependence: Secondary | ICD-10-CM | POA: Diagnosis not present

## 2022-08-10 DIAGNOSIS — R109 Unspecified abdominal pain: Secondary | ICD-10-CM | POA: Diagnosis present

## 2022-08-10 DIAGNOSIS — E039 Hypothyroidism, unspecified: Secondary | ICD-10-CM | POA: Diagnosis not present

## 2022-08-10 DIAGNOSIS — G8929 Other chronic pain: Secondary | ICD-10-CM | POA: Diagnosis not present

## 2022-08-10 DIAGNOSIS — R1031 Right lower quadrant pain: Secondary | ICD-10-CM | POA: Diagnosis not present

## 2022-08-10 DIAGNOSIS — E119 Type 2 diabetes mellitus without complications: Secondary | ICD-10-CM | POA: Insufficient documentation

## 2022-08-10 DIAGNOSIS — R1011 Right upper quadrant pain: Secondary | ICD-10-CM | POA: Insufficient documentation

## 2022-08-10 DIAGNOSIS — Z7901 Long term (current) use of anticoagulants: Secondary | ICD-10-CM | POA: Insufficient documentation

## 2022-08-10 DIAGNOSIS — J45909 Unspecified asthma, uncomplicated: Secondary | ICD-10-CM | POA: Diagnosis not present

## 2022-08-10 LAB — URINALYSIS, ROUTINE W REFLEX MICROSCOPIC
Bilirubin Urine: NEGATIVE
Glucose, UA: NEGATIVE mg/dL
Hgb urine dipstick: NEGATIVE
Ketones, ur: NEGATIVE mg/dL
Leukocytes,Ua: NEGATIVE
Nitrite: POSITIVE — AB
Specific Gravity, Urine: 1.017 (ref 1.005–1.030)
pH: 6.5 (ref 5.0–8.0)

## 2022-08-10 LAB — HEPATIC FUNCTION PANEL
ALT: 6 U/L (ref 0–44)
AST: 20 U/L (ref 15–41)
Albumin: 4 g/dL (ref 3.5–5.0)
Alkaline Phosphatase: 80 U/L (ref 38–126)
Bilirubin, Direct: 0.1 mg/dL (ref 0.0–0.2)
Indirect Bilirubin: 0.4 mg/dL (ref 0.3–0.9)
Total Bilirubin: 0.5 mg/dL (ref 0.3–1.2)
Total Protein: 8.4 g/dL — ABNORMAL HIGH (ref 6.5–8.1)

## 2022-08-10 LAB — BASIC METABOLIC PANEL
Anion gap: 8 (ref 5–15)
BUN: 16 mg/dL (ref 8–23)
CO2: 27 mmol/L (ref 22–32)
Calcium: 9.7 mg/dL (ref 8.9–10.3)
Chloride: 106 mmol/L (ref 98–111)
Creatinine, Ser: 1.48 mg/dL — ABNORMAL HIGH (ref 0.44–1.00)
GFR, Estimated: 35 mL/min — ABNORMAL LOW (ref >60)
Glucose, Bld: 105 mg/dL — ABNORMAL HIGH (ref 70–99)
Potassium: 5.1 mmol/L (ref 3.5–5.1)
Sodium: 141 mmol/L (ref 135–145)

## 2022-08-10 LAB — CBC
HCT: 43.6 % (ref 36.0–46.0)
Hemoglobin: 13.6 g/dL (ref 12.0–15.0)
MCH: 27 pg (ref 26.0–34.0)
MCHC: 31.2 g/dL (ref 30.0–36.0)
MCV: 86.5 fL (ref 80.0–100.0)
Platelets: 250 10*3/uL (ref 150–400)
RBC: 5.04 MIL/uL (ref 3.87–5.11)
RDW: 14.9 % (ref 11.5–15.5)
WBC: 5.4 10*3/uL (ref 4.0–10.5)
nRBC: 0 % (ref 0.0–0.2)

## 2022-08-10 LAB — LIPASE, BLOOD: Lipase: 12 U/L (ref 11–51)

## 2022-08-10 MED ORDER — LIDOCAINE 5 % EX PTCH: 1.0000 | MEDICATED_PATCH | CUTANEOUS | 0 refills | Status: AC

## 2022-08-10 MED ORDER — SODIUM CHLORIDE 0.9 % IV BOLUS
500.0000 mL | Freq: Once | INTRAVENOUS | Status: AC
  Administered 2022-08-10: 500 mL via INTRAVENOUS

## 2022-08-10 MED ORDER — ONDANSETRON HCL 4 MG/2ML IJ SOLN
4.0000 mg | Freq: Once | INTRAMUSCULAR | Status: AC
  Administered 2022-08-10: 4 mg via INTRAVENOUS
  Filled 2022-08-10: qty 2

## 2022-08-10 MED ORDER — MORPHINE SULFATE (PF) 4 MG/ML IV SOLN
4.0000 mg | Freq: Once | INTRAVENOUS | Status: AC
  Administered 2022-08-10: 4 mg via INTRAVENOUS
  Filled 2022-08-10: qty 1

## 2022-08-10 MED ORDER — ONDANSETRON HCL 4 MG PO TABS: 4.0000 mg | ORAL_TABLET | Freq: Three times a day (TID) | ORAL | 0 refills | Status: AC | PRN

## 2022-08-10 NOTE — ED Triage Notes (Addendum)
Pt states that when she woke up she wasn't feeling good. C/o R flank pain radiating under R breast, she vomited, and generalized weakness. The flank pain has been going on x 1 week.

## 2022-08-10 NOTE — ED Notes (Signed)
Pt states that she feels like it is "her time to go" and that she is seeing her grandson standing with her in the room, although she knows that he is not really there. Paramedic Josh aware.

## 2022-08-10 NOTE — ED Notes (Signed)
Discharge paperwork given and verbally understood. 

## 2022-08-10 NOTE — ED Provider Notes (Signed)
[facility] EMERGENCY DEPT Provider Note  CSN: [id number] Arrival date & time: 08/10/22 1023  Chief Complaint(s) Flank Pain  HPI Traci Wilson is a 86 y.o. female with history of lupus, prior pulmonary embolism on Xarelto presenting to the emergency department with right-sided abdominal pain.  She reports some chronic right-sided abdominal pain after a fall a year ago.  She reports that she woke up this morning feeling worse with right flank and right upper and lower quadrant abdominal pain.  She reports nausea and vomiting this morning with 1 episode of vomiting.  No hematemesis, melena, hematochezia.  No fevers or chills.  No urinary symptoms.  No headaches or chest pain.  No shortness of breath.   Past Medical History Past Medical History:  Diagnosis Date   Anemia    Asthma    hx of   Dysrhythmia    beats fast at times   Fibromyalgia    Glaucoma    History of kidney stones    Hypertension    Hypothyroidism    Lupus (HCC)    MVP (mitral valve prolapse)    Pneumonia    Pulmonary embolism (HCC)    Vertigo 2017   Patient Active Problem List   Diagnosis Date Noted   Type 2 diabetes mellitus (HCC) 01/25/2022   Low back pain 01/25/2022   Sedimentation rate elevation 01/25/2022   Epiretinal membrane (ERM) of both eyes 12/15/2021   Decreased vision of right eye 12/15/2021   Posterior capsular opacification of both eyes, obscuring vision 12/15/2021   Vitamin D deficiency 12/30/2020   Thoracic back pain 12/05/2020   Hypertensive disorder 11/18/2020   Deep venous thrombosis (HCC) 11/18/2020   Lupus erythematosus 06/20/2020   Fibromyalgia 06/20/2020   Postsurgical states following surgery of eye and adnexa 01/01/2019   Primary open angle glaucoma (POAG) of both eyes, severe stage 09/25/2018   Pseudophakia of both eyes 09/25/2018   Subretinal hemorrhage of right eye 09/25/2018   Atrophic kidney 07/23/2018   Hematuria 04/16/2018   Abdominal pain, acute, right  lower quadrant 03/08/2018   Increased frequency of urination 12/03/2014   Nocturia 12/03/2014   Rectocele 12/03/2014   Slow transit constipation 12/03/2014   Dyspnea 05/29/2012   Hypothyroid 05/29/2012   Cough 03/06/2012   Home Medication(s) Prior to Admission medications   Medication Sig Start Date End Date Taking? Authorizing Provider  lidocaine (LIDODERM) 5 % Place 1 patch onto the skin daily. Remove & Discard patch within 12 hours or as directed by MD 08/10/22  Yes Lonell Grandchild, MD  ondansetron (ZOFRAN) 4 MG tablet Take 1 tablet (4 mg total) by mouth every 8 (eight) hours as needed for nausea or vomiting. 08/10/22  Yes Lonell Grandchild, MD  brimonidine (ALPHAGAN) 0.2 % ophthalmic solution Place 1 drop into both eyes 2 (two) times daily.    [provider]  Cholecalciferol 25 MCG (1000 UT) capsule Take by mouth.    [provider]  diazepam (VALIUM) 5 MG tablet Take 5 mg by mouth at bedtime as needed. 11/17/21   [provider]  levothyroxine (SYNTHROID, LEVOTHROID) 150 MCG tablet Take 150 mcg by mouth daily before breakfast.    [provider]  prednisoLONE acetate (PRED FORTE) 1 % ophthalmic suspension Place into the right eye. 01/13/22   [provider]  rivaroxaban (XARELTO) 20 MG TABS tablet Take 1 tablet (20 mg total) by mouth daily with supper. Patient not taking: Reported on 01/25/2022 04/17/18   Ray Church III,  MD                                                                                                                                    Past Surgical History Past Surgical History:  Procedure Laterality Date   ABDOMINAL HYSTERECTOMY     APPENDECTOMY     BUNIONECTOMY     bil   CATARACT EXTRACTION, BILATERAL     CYSTOSCOPY WITH STENT PLACEMENT Right 03/11/2018   Procedure: CYSTOSCOPY WITH RIGHT STENT PLACEMENT;  Surgeon: Crista ElliotBell, Eugene D III, MD;  Location: [facility];  Service: Urology;  Laterality: Right;    CYSTOSCOPY/RETROGRADE/URETEROSCOPY     Dr. Alvester MorinBell 04-16-18    CYSTOSCOPY/URETEROSCOPY/HOLMIUM LASER/STENT PLACEMENT Right 04/16/2018   Procedure: CYSTOSCOPY RIGHT RETROGRADE PYELOGRAM RIGHT URETEROSCOPY HOLMIUM LASER RIGHT URETER BIOPSY STENT PLACEMENT;  Surgeon: Crista ElliotBell, Eugene D III, MD;  Location: [facility];  Service: Urology;  Laterality: Right;   EYE SURGERY     bil   GLAUCOMA SURGERY     LAPAROSCOPIC NEPHRECTOMY, HAND ASSISTED Right 07/23/2018   Procedure: RIGHT HAND ASSISTED LAPAROSCOPIC NEPHRECTOMY;  Surgeon: Crista ElliotBell, Eugene D III, MD;  Location: [facility];  Service: Urology;  Laterality: Right;   ROTATOR CUFF REPAIR     right   Family History Family History  Problem Relation Age of Onset   Heart disease Mother    Heart disease Father    Heart disease Brother     Social History Social History   Tobacco Use   Smoking status: Never    Passive exposure: Past   Smokeless tobacco: Former    Types: Snuff  Vaping Use   Vaping Use: Never used  Substance Use Topics   Alcohol use: No   Drug use: No   Allergies Codeine  Review of Systems Review of Systems  All other systems reviewed and are negative.   Physical Exam Vital Signs  I have reviewed the triage vital signs BP (!) 142/69   Pulse (!) 53   Temp 99.1 F (37.3 C)   Resp 14   Ht 5\' 5"  (1.651 m)   Wt 99.8 kg   SpO2 97%   BMI 36.61 kg/m  Physical Exam Vitals and nursing note reviewed.  Constitutional:      General: She is not in acute distress.    Appearance: She is well-developed.  HENT:     Head: Normocephalic and atraumatic.     Mouth/Throat:     Mouth: Mucous membranes are moist.  Eyes:     Pupils: Pupils are equal, round, and reactive to light.  Cardiovascular:     Rate and Rhythm: Normal rate and regular rhythm.     Heart sounds: No murmur heard. Pulmonary:     Effort: Pulmonary effort is normal. No respiratory distress.     Breath sounds: Normal breath sounds.  Abdominal:     General: Abdomen is flat.      Palpations: Abdomen is  soft.     Tenderness: There is abdominal tenderness (RLQ and RUQ). There is no right CVA tenderness or left CVA tenderness.  Musculoskeletal:        General: No tenderness.     Right lower leg: No edema.     Left lower leg: No edema.  Skin:    General: Skin is warm and dry.  Neurological:     General: No focal deficit present.     Mental Status: She is alert. Mental status is at baseline.  Psychiatric:        Mood and Affect: Mood normal.        Behavior: Behavior normal.     ED Results and Treatments Labs (all labs ordered are listed, but only abnormal results are displayed) Labs Reviewed  URINALYSIS, ROUTINE W REFLEX MICROSCOPIC - Abnormal; Notable for the following components:      Result Value   APPearance HAZY (*)    Protein, ur TRACE (*)    Nitrite POSITIVE (*)    Bacteria, UA MANY (*)    All other components within normal limits  BASIC METABOLIC PANEL - Abnormal; Notable for the following components:   Glucose, Bld 105 (*)    Creatinine, Ser 1.48 (*)    GFR, Estimated 35 (*)    All other components within normal limits  HEPATIC FUNCTION PANEL - Abnormal; Notable for the following components:   Total Protein 8.4 (*)    All other components within normal limits  URINE CULTURE  CBC  LIPASE, BLOOD                                                                                                                          Radiology CT ABDOMEN PELVIS WO CONTRAST  Result Date: 08/10/2022 CLINICAL DATA:  Right flank pain, vomiting, history of right nephrectomy EXAM: CT ABDOMEN AND PELVIS WITHOUT CONTRAST TECHNIQUE: Multidetector CT imaging of the abdomen and pelvis was performed following the standard protocol without IV contrast. RADIATION DOSE REDUCTION: This exam was performed according to the departmental dose-optimization program which includes automated exposure control, adjustment of the mA and/or kV according to patient size and/or use of  iterative reconstruction technique. COMPARISON:  04/08/2019 FINDINGS: Lower chest: There is ectasia of bronchi in the lower lung fields. There are small linear densities in the posterior lower lung fields. Hepatobiliary: There is 2.5 cm smooth marginated fluid density lesion in the left lobe, possibly a cyst. There is no dilation of bile ducts. Gallbladder is unremarkable. Pancreas: No focal abnormalities are seen. Spleen: Unremarkable. Adrenals/Urinary Tract: Adrenals are unremarkable. There is previous right nephrectomy. Left kidney shows no hydronephrosis. There are no left renal or ureteral stones. Urinary bladder is unremarkable. There are few smooth marginated fluid density lesions in left kidney measuring up to 4.6 cm in size. Stomach/Bowel: Small hiatal hernia is seen. Small bowel loops are not dilated. Appendix is not dilated. Scattered diverticula are seen in colon without signs of focal acute diverticulitis.  Vascular/Lymphatic: There are scattered arterial calcifications. Reproductive: Uterus is not seen.  There are no adnexal masses. Other: There is no ascites or pneumoperitoneum. Umbilical hernia containing fat is seen. Musculoskeletal: Degenerative changes are noted in lumbar spine with encroachment of neural foramina at multiple levels. No acute findings are noted. IMPRESSION: There is no evidence of intestinal obstruction or pneumoperitoneum. There is no hydronephrosis. Appendix is not dilated. Status post right nephrectomy. Hepatic and left renal cysts are seen. Scattered diverticula are seen in colon without signs of focal diverticulitis. Small hiatal hernia. Lumbar spondylosis. Bronchiectasis. Other findings as described in the body of the report. Electronically Signed   By: Ernie Avena M.D.   On: 08/10/2022 14:26    Pertinent labs & imaging results that were available during my care of the patient were reviewed by me and considered in my medical decision making (see MDM for  details).  Medications Ordered in ED Medications  morphine (PF) 4 MG/ML injection 4 mg (4 mg Intravenous Given 08/10/22 1239)  ondansetron (ZOFRAN) injection 4 mg (4 mg Intravenous Given 08/10/22 1239)  sodium chloride 0.9 % bolus 500 mL (0 mLs Intravenous Stopped 08/10/22 1459)                                                                                                                                     Procedures Procedures  (including critical care time)  Medical Decision Making / ED Course   MDM:  86 year old female presenting to the emergency department with abdominal pain.  Patient well-appearing, no fever in the emergency department, vital signs reassuring.  Differential includes urinary infection, cholecystitis, stump appendicitis, nephrolithiasis, obstruction, volvulus, abscess, other intra-abdominal process.  Will obtain labs and treat symptoms.  Will obtain CT to further evaluate for intra-abdominal process.  Will reassess closely.  Clinical Course as of 08/10/22 1505  Wed Aug 10, 2022  1500 CT scan without obvious cause of the patient's symptoms.  On further discussion with the patient and her daughter, her symptoms today are very similar to her chronic abdominal pain which she has had for at least a year.  She feels much better after treatment.  Will prescribe Zofran given mild nausea and lidocaine patches as patient reports the pain is worse with movement, could represent chronic muscle pain after fall.  Given age, discussed strict return precautions.  Labs notable for chronic kidney disease roughly at baseline, patient received fluids.  UA also contaminated with squamous cells and bacteria, will send culture given nitrites but patient not having pain on left side and she has had a right nephrectomy. Will discharge patient to home. All questions answered. Patient comfortable with plan of discharge. Return precautions discussed with patient and specified on the after visit  summary.  [WS]    Clinical Course User Index [WS] Lonell Grandchild, MD     Additional history obtained: -Additional history obtained from family -External records from outside  source obtained and reviewed including: Chart review including previous notes, labs, imaging, consultation notes including PMD visit 08/30/20 for similar symptoms   Lab Tests: -I ordered, reviewed, and interpreted labs.   The pertinent results include:   Labs Reviewed  URINALYSIS, ROUTINE W REFLEX MICROSCOPIC - Abnormal; Notable for the following components:      Result Value   APPearance HAZY (*)    Protein, ur TRACE (*)    Nitrite POSITIVE (*)    Bacteria, UA MANY (*)    All other components within normal limits  BASIC METABOLIC PANEL - Abnormal; Notable for the following components:   Glucose, Bld 105 (*)    Creatinine, Ser 1.48 (*)    GFR, Estimated 35 (*)    All other components within normal limits  HEPATIC FUNCTION PANEL - Abnormal; Notable for the following components:   Total Protein 8.4 (*)    All other components within normal limits  URINE CULTURE  CBC  LIPASE, BLOOD    Notable for contaminated UA, CKD, no leukocytosis  EKG   EKG Interpretation  Date/Time:  Wednesday August 10 2022 11:06:11 EST Ventricular Rate:  52 PR Interval:  200 QRS Duration: 90 QT Interval:  450 QTC Calculation: 418 R Axis:   -23 Text Interpretation: Sinus bradycardia Nonspecific T wave abnormality Abnormal ECG When compared with ECG of 13-Apr-2018 13:59, No significant change was found Confirmed by Alvino Blood ([id number]) on 08/10/2022 12:11:38 PM         Imaging Studies ordered: I ordered imaging studies including CT A/P On my interpretation imaging demonstrates no acute process, prior R nephrectomy I independently visualized and interpreted imaging. I agree with the radiologist interpretation   Medicines ordered and prescription drug management: Meds ordered this encounter  Medications    morphine (PF) 4 MG/ML injection 4 mg   ondansetron (ZOFRAN) injection 4 mg   sodium chloride 0.9 % bolus 500 mL   lidocaine (LIDODERM) 5 %    Sig: Place 1 patch onto the skin daily. Remove & Discard patch within 12 hours or as directed by MD    Dispense:  30 patch    Refill:  0   ondansetron (ZOFRAN) 4 MG tablet    Sig: Take 1 tablet (4 mg total) by mouth every 8 (eight) hours as needed for nausea or vomiting.    Dispense:  12 tablet    Refill:  0    -I have reviewed the patients home medicines and have made adjustments as needed  Social Determinants of Health:  Diagnosis or treatment significantly limited by social determinants of health: obesity   Reevaluation: After the interventions noted above, I reevaluated the patient and found that they have improved  Co morbidities that complicate the patient evaluation  Past Medical History:  Diagnosis Date   Anemia    Asthma    hx of   Dysrhythmia    beats fast at times   Fibromyalgia    Glaucoma    History of kidney stones    Hypertension    Hypothyroidism    Lupus (HCC)    MVP (mitral valve prolapse)    Pneumonia    Pulmonary embolism (HCC)    Vertigo 2017      Dispostion: Disposition decision including need for hospitalization was considered, and patient discharged from emergency department.    Final Clinical Impression(s) / ED Diagnoses Final diagnoses:  Chronic abdominal pain     This chart was dictated using voice recognition software.  Despite best  efforts to proofread,  errors can occur which can change the documentation meaning.    Lonell Grandchild, MD 08/10/22 1505

## 2022-08-10 NOTE — Discharge Instructions (Addendum)
We evaluated you for your abdominal pain nausea and vomiting.  Your laboratory testing and CT scan did not show a dangerous cause of your abdominal pain.  Since you have had this pain for a long time, it could be related to your prior fall.  Please try 1000 mg of Tylenol every 6 hours as needed for pain.  I have also prescribed lidocaine patches which you can apply to the site of pain.  If you develop nausea, please take the prescribed Zofran as needed once every 8 hours.  If your symptoms change or worsen, or you develop any severe abdominal pain, persistent vomiting, painful urination, diarrhea, blood in your vomit or blood in your stool, or any other concerning symptoms please return immediately to the emergency department.

## 2022-08-10 NOTE — ED Notes (Signed)
Smith RN, started IV, was able to obtain blood work and while starting fluids, the Pt stated that it was hurting and IV was removed... He notified provider.Marland KitchenMarland Kitchen

## 2022-08-13 LAB — URINE CULTURE: Culture: >=100000 — AB

## 2022-08-14 ENCOUNTER — Telehealth (HOSPITAL_BASED_OUTPATIENT_CLINIC_OR_DEPARTMENT_OTHER): Payer: Self-pay | Admitting: Emergency Medicine

## 2022-08-14 NOTE — Telephone Encounter (Signed)
Post ED Visit - Positive Culture Follow-up  Culture report reviewed by antimicrobial stewardship pharmacist: [facility] []  , Pharm.D. []  Enzo Bi, Pharm.D., BCPS AQ-ID []  , Pharm.D., BCPS []  Celedonio Miyamoto, Pharm.D., BCPS []  [place], Garvin Fila.D., BCPS, AAHIVP []  , Pharm.D., BCPS, AAHIVP []  Georgina Pillion, PharmD, BCPS []  , PharmD, BCPS []  [place], PharmD, BCPS []  [address], PharmD []  , PharmD, BCPS []  Estella Husk, PharmD  Pharmacy Team []  Lysle Pearl, PharmD []  , PharmD []  Phillips Climes, PharmD []  , Rph []  Agapito Games) , PharmD []  Verlan Friends, PharmD []  , PharmD []  Mervyn Gay, PharmD []  , PharmD []  Vinnie Level, PharmD []  [facility], PharmD []  , PharmD []  Len Childs, PharmD   Positive urine culture No further patient follow-up is required at this time. [facility] PA  Greer Pickerel C Saragrace Selke 08/14/2022, 1:54 PM

## 2022-08-18 ENCOUNTER — Other Ambulatory Visit (HOSPITAL_BASED_OUTPATIENT_CLINIC_OR_DEPARTMENT_OTHER): Payer: Self-pay
